# Patient Record
Sex: Male | Born: 1935 | Race: White | Hispanic: No | Smoking: Former smoker
Health system: Southern US, Community
[De-identification: ages and names within clinical notes are randomized; demographics above are authoritative.]

## PROBLEM LIST (undated history)

## (undated) DIAGNOSIS — R51 Headache: Secondary | ICD-10-CM

## (undated) HISTORY — DX: Headache: R51

---

## 1999-12-17 ENCOUNTER — Inpatient Hospital Stay (HOSPITAL_COMMUNITY): Admission: EM | Admit: 1999-12-17 | Discharge: 1999-12-21 | Payer: Self-pay | Admitting: Emergency Medicine

## 1999-12-17 ENCOUNTER — Encounter: Payer: Self-pay | Admitting: Emergency Medicine

## 1999-12-17 ENCOUNTER — Encounter: Payer: Self-pay | Admitting: Orthopaedic Surgery

## 2000-04-16 ENCOUNTER — Encounter: Admission: RE | Admit: 2000-04-16 | Discharge: 2000-07-15 | Payer: Self-pay | Admitting: Orthopaedic Surgery

## 2000-07-16 ENCOUNTER — Encounter: Admission: RE | Admit: 2000-07-16 | Discharge: 2000-08-17 | Payer: Self-pay | Admitting: Orthopaedic Surgery

## 2004-05-10 ENCOUNTER — Ambulatory Visit: Payer: Self-pay | Admitting: Family Medicine

## 2005-05-03 ENCOUNTER — Ambulatory Visit: Payer: Self-pay | Admitting: Family Medicine

## 2006-04-04 ENCOUNTER — Ambulatory Visit: Payer: Self-pay | Admitting: Family Medicine

## 2006-04-05 ENCOUNTER — Encounter: Admission: RE | Admit: 2006-04-05 | Discharge: 2006-04-05 | Payer: Self-pay | Admitting: Family Medicine

## 2006-09-06 ENCOUNTER — Ambulatory Visit: Payer: Self-pay | Admitting: Family Medicine

## 2006-09-06 LAB — CONVERTED CEMR LAB
ALT: 29 units/L (ref 0–40)
AST: 25 units/L (ref 0–37)
Albumin: 3.9 g/dL (ref 3.5–5.2)
Alkaline Phosphatase: 86 units/L (ref 39–117)
BUN: 12 mg/dL (ref 6–23)
Basophils Absolute: 0 10*3/uL (ref 0.0–0.1)
Basophils Relative: 0.2 % (ref 0.0–1.0)
Bilirubin, Direct: 0.2 mg/dL (ref 0.0–0.3)
CO2: 28 meq/L (ref 19–32)
Calcium: 9.1 mg/dL (ref 8.4–10.5)
Chloride: 106 meq/L (ref 96–112)
Cholesterol: 179 mg/dL (ref 0–200)
Creatinine, Ser: 0.6 mg/dL (ref 0.4–1.5)
Eosinophils Absolute: 0.2 10*3/uL (ref 0.0–0.6)
Eosinophils Relative: 2.4 % (ref 0.0–5.0)
GFR calc Af Amer: 170 mL/min
GFR calc non Af Amer: 141 mL/min
Glucose, Bld: 79 mg/dL (ref 70–99)
HCT: 47.5 % (ref 39.0–52.0)
HDL: 42.9 mg/dL (ref >39.0)
Hemoglobin: 16 g/dL (ref 13.0–17.0)
LDL Cholesterol: 118 mg/dL — ABNORMAL HIGH (ref 0–99)
Lymphocytes Relative: 16.4 % (ref 12.0–46.0)
MCHC: 33.7 g/dL (ref 30.0–36.0)
MCV: 92.3 fL (ref 78.0–100.0)
Monocytes Absolute: 0.7 10*3/uL (ref 0.2–0.7)
Monocytes Relative: 8.6 % (ref 3.0–11.0)
Neutro Abs: 6.2 10*3/uL (ref 1.4–7.7)
Neutrophils Relative %: 72.4 % (ref 43.0–77.0)
PSA: 3.16 ng/mL (ref 0.10–4.00)
Platelets: 313 10*3/uL (ref 150–400)
Potassium: 3.8 meq/L (ref 3.5–5.1)
RBC: 5.14 M/uL (ref 4.22–5.81)
RDW: 12.9 % (ref 11.5–14.6)
Sodium: 141 meq/L (ref 135–145)
TSH: 2.07 microintl units/mL (ref 0.35–5.50)
Total Bilirubin: 1.1 mg/dL (ref 0.3–1.2)
Total CHOL/HDL Ratio: 4.2
Total Protein: 6.9 g/dL (ref 6.0–8.3)
Triglycerides: 92 mg/dL (ref 0–149)
VLDL: 18 mg/dL (ref 0–40)
WBC: 8.5 10*3/uL (ref 4.5–10.5)

## 2006-09-19 ENCOUNTER — Ambulatory Visit: Payer: Self-pay | Admitting: Family Medicine

## 2006-10-04 ENCOUNTER — Ambulatory Visit: Payer: Self-pay | Admitting: Pulmonary Disease

## 2006-10-12 ENCOUNTER — Ambulatory Visit: Admission: RE | Admit: 2006-10-12 | Discharge: 2006-10-12 | Payer: Self-pay | Admitting: Pulmonary Disease

## 2006-10-12 ENCOUNTER — Encounter: Payer: Self-pay | Admitting: Pulmonary Disease

## 2006-10-17 ENCOUNTER — Ambulatory Visit: Payer: Self-pay | Admitting: Pulmonary Disease

## 2006-11-06 ENCOUNTER — Encounter (HOSPITAL_COMMUNITY): Admission: RE | Admit: 2006-11-06 | Discharge: 2007-02-04 | Payer: Self-pay | Admitting: Pulmonary Disease

## 2006-12-06 ENCOUNTER — Ambulatory Visit: Payer: Self-pay | Admitting: Family Medicine

## 2006-12-12 ENCOUNTER — Ambulatory Visit: Payer: Self-pay | Admitting: Pulmonary Disease

## 2006-12-20 ENCOUNTER — Encounter: Payer: Self-pay | Admitting: Family Medicine

## 2006-12-20 ENCOUNTER — Ambulatory Visit: Payer: Self-pay | Admitting: Family Medicine

## 2006-12-20 DIAGNOSIS — R519 Headache, unspecified: Secondary | ICD-10-CM | POA: Insufficient documentation

## 2006-12-20 DIAGNOSIS — R51 Headache: Secondary | ICD-10-CM | POA: Insufficient documentation

## 2007-01-15 ENCOUNTER — Ambulatory Visit: Payer: Self-pay | Admitting: Pulmonary Disease

## 2007-01-31 ENCOUNTER — Encounter: Payer: Self-pay | Admitting: Pulmonary Disease

## 2007-02-19 ENCOUNTER — Encounter (HOSPITAL_COMMUNITY): Admission: RE | Admit: 2007-02-19 | Discharge: 2007-05-20 | Payer: Self-pay | Admitting: Pulmonary Disease

## 2007-03-29 ENCOUNTER — Ambulatory Visit: Payer: Self-pay | Admitting: Family Medicine

## 2007-04-15 ENCOUNTER — Ambulatory Visit: Payer: Self-pay | Admitting: Pulmonary Disease

## 2007-05-21 ENCOUNTER — Encounter (HOSPITAL_COMMUNITY): Admission: RE | Admit: 2007-05-21 | Discharge: 2007-06-18 | Payer: Self-pay | Admitting: Pulmonary Disease

## 2007-06-20 ENCOUNTER — Encounter (HOSPITAL_COMMUNITY): Admission: RE | Admit: 2007-06-20 | Discharge: 2007-07-19 | Payer: Self-pay | Admitting: Pulmonary Disease

## 2007-07-21 ENCOUNTER — Encounter (HOSPITAL_COMMUNITY): Admission: RE | Admit: 2007-07-21 | Discharge: 2007-10-19 | Payer: Self-pay | Admitting: Pulmonary Disease

## 2007-09-12 IMAGING — CR DG CHEST 2V
2 series · 2 of 2 positions shown · non-contrast
Comparison: none

CLINICAL DATA: Shortness of breath, wheezing. 
 CHEST X-RAY: 
 Two views of the chest show the lungs to be hyperaerated consistent with COPD.  Mild peribronchial thickening is noted.  The heart is within normal limits in size.

[view not recorded (1 of 2)]
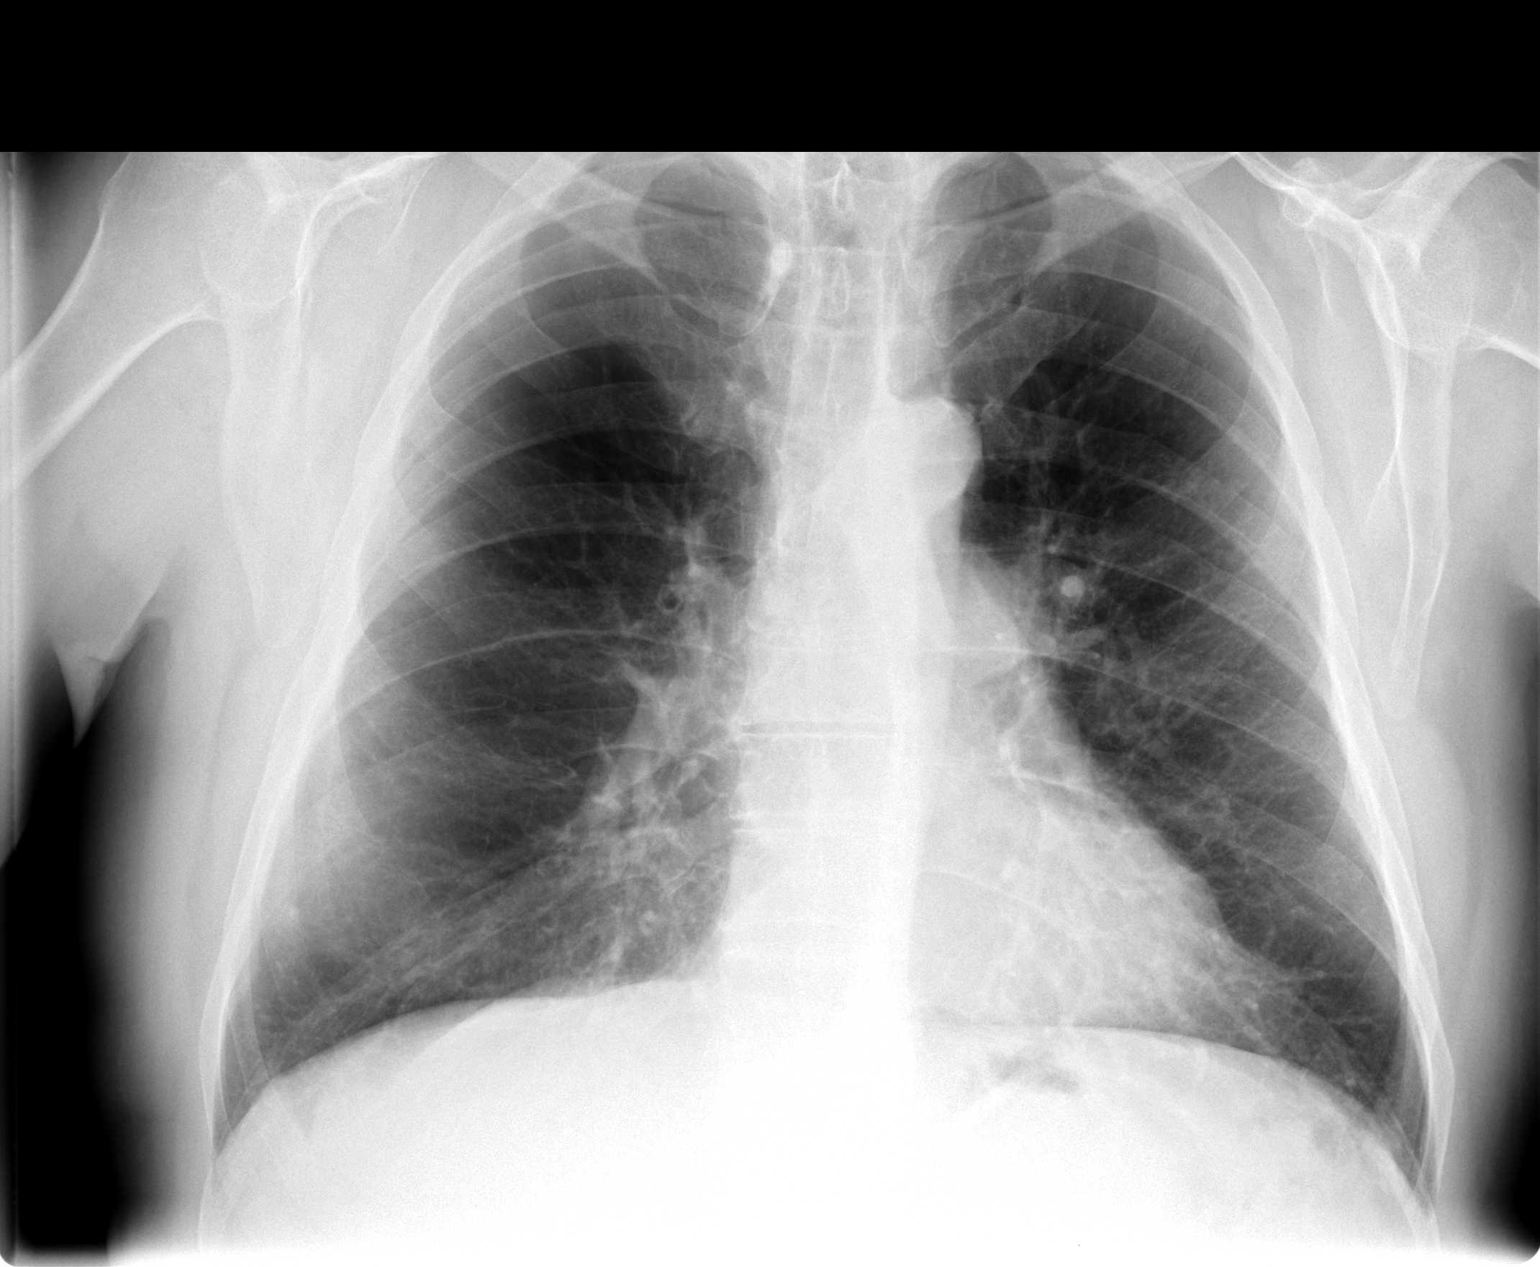

[view not recorded (2 of 2)]
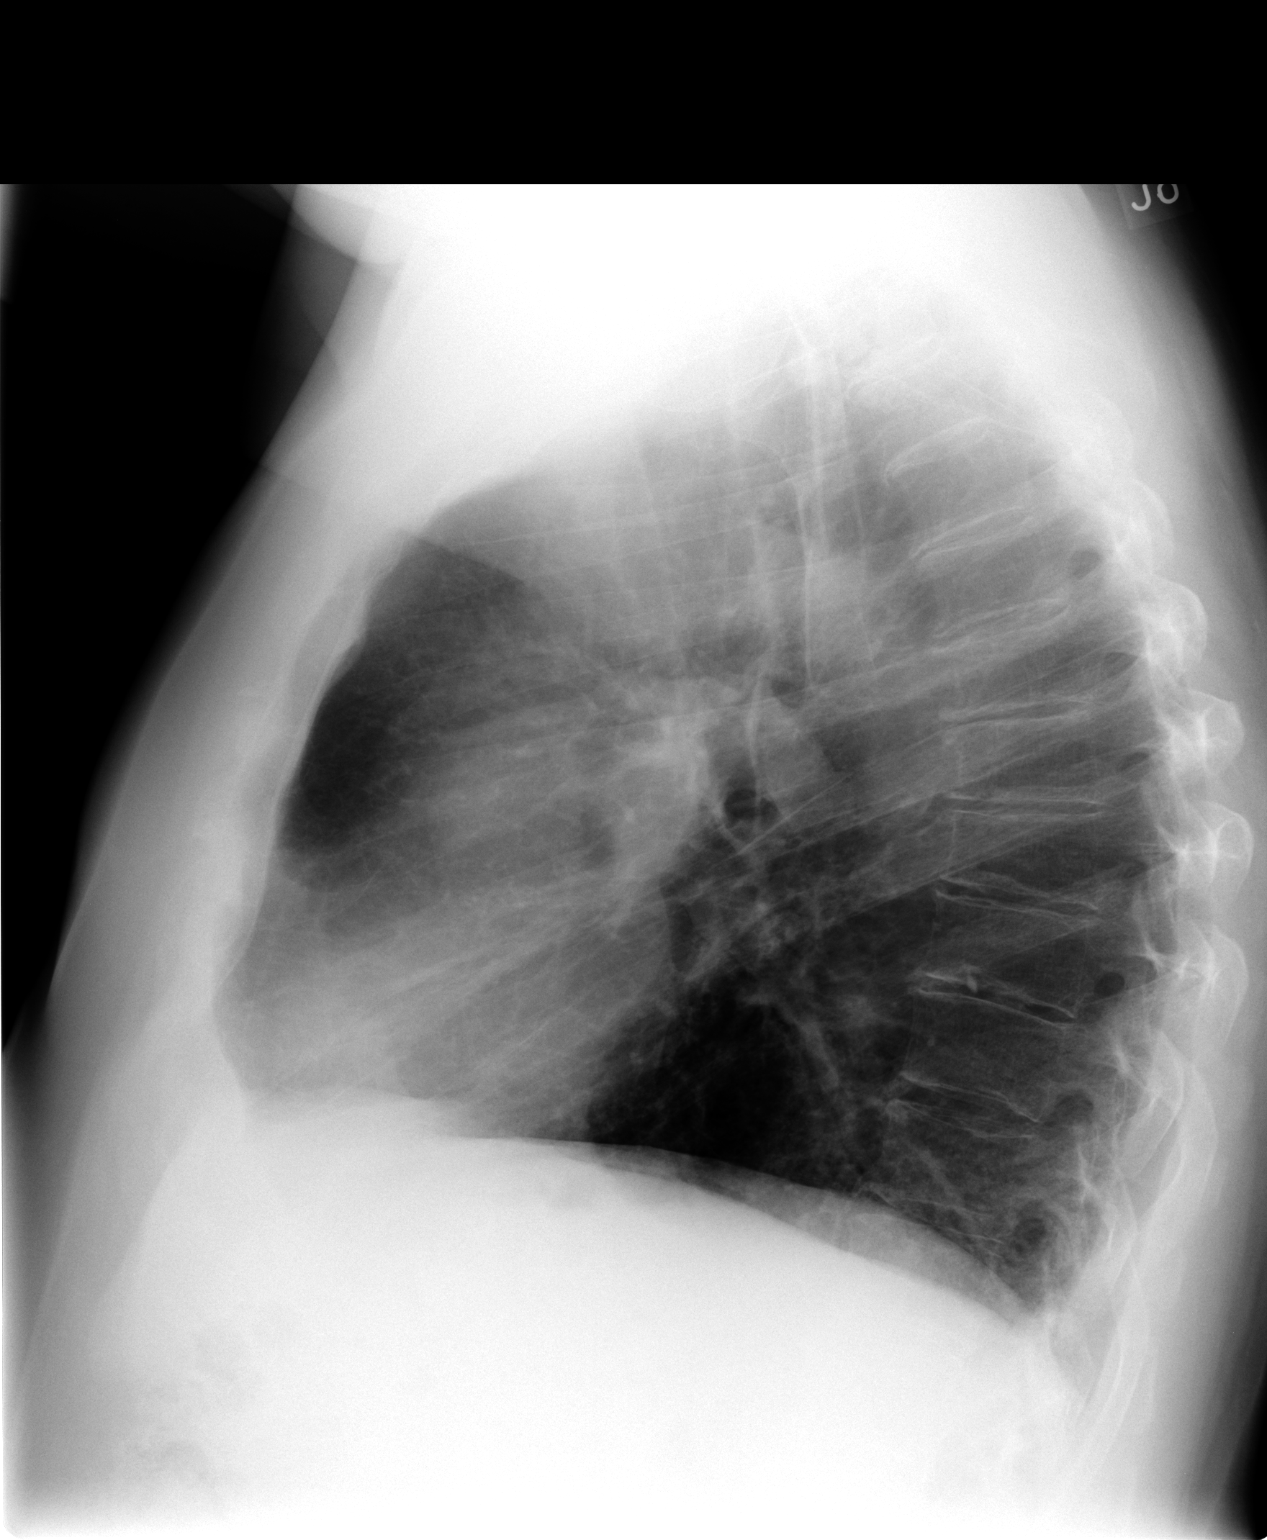

[2 of 2 positions shown; findings below may reference images not displayed]

IMPRESSION: COPD.  No active lung disease.

## 2007-10-20 ENCOUNTER — Encounter (HOSPITAL_COMMUNITY): Admission: RE | Admit: 2007-10-20 | Discharge: 2008-01-18 | Payer: Self-pay | Admitting: Pulmonary Disease

## 2007-11-13 DIAGNOSIS — J439 Emphysema, unspecified: Secondary | ICD-10-CM

## 2007-11-14 ENCOUNTER — Ambulatory Visit: Payer: Self-pay | Admitting: Pulmonary Disease

## 2007-11-14 DIAGNOSIS — J9610 Chronic respiratory failure, unspecified whether with hypoxia or hypercapnia: Secondary | ICD-10-CM

## 2007-12-25 ENCOUNTER — Encounter: Payer: Self-pay | Admitting: Critical Care Medicine

## 2008-01-19 ENCOUNTER — Encounter (HOSPITAL_COMMUNITY): Admission: RE | Admit: 2008-01-19 | Discharge: 2008-04-18 | Payer: Self-pay | Admitting: Pulmonary Disease

## 2008-03-08 ENCOUNTER — Encounter: Payer: Self-pay | Admitting: Pulmonary Disease

## 2008-03-13 ENCOUNTER — Ambulatory Visit: Payer: Self-pay | Admitting: Pulmonary Disease

## 2008-04-21 ENCOUNTER — Encounter (HOSPITAL_COMMUNITY): Admission: RE | Admit: 2008-04-21 | Discharge: 2008-07-19 | Payer: Self-pay | Admitting: Pulmonary Disease

## 2008-05-28 ENCOUNTER — Ambulatory Visit: Payer: Self-pay | Admitting: Pulmonary Disease

## 2008-07-16 ENCOUNTER — Ambulatory Visit: Payer: Self-pay | Admitting: Pulmonary Disease

## 2008-07-20 ENCOUNTER — Encounter (HOSPITAL_COMMUNITY): Admission: RE | Admit: 2008-07-20 | Discharge: 2008-10-18 | Payer: Self-pay | Admitting: Pulmonary Disease

## 2008-10-19 ENCOUNTER — Encounter (HOSPITAL_COMMUNITY): Admission: RE | Admit: 2008-10-19 | Discharge: 2009-01-17 | Payer: Self-pay | Admitting: Pulmonary Disease

## 2008-11-19 ENCOUNTER — Ambulatory Visit: Payer: Self-pay | Admitting: Pulmonary Disease

## 2008-11-20 ENCOUNTER — Telehealth (INDEPENDENT_AMBULATORY_CARE_PROVIDER_SITE_OTHER): Payer: Self-pay | Admitting: *Deleted

## 2009-01-11 ENCOUNTER — Encounter: Payer: Self-pay | Admitting: Pulmonary Disease

## 2009-01-14 ENCOUNTER — Encounter: Payer: Self-pay | Admitting: Family Medicine

## 2009-01-18 ENCOUNTER — Encounter (HOSPITAL_COMMUNITY): Admission: RE | Admit: 2009-01-18 | Discharge: 2009-04-18 | Payer: Self-pay | Admitting: Pulmonary Disease

## 2009-01-21 ENCOUNTER — Telehealth (INDEPENDENT_AMBULATORY_CARE_PROVIDER_SITE_OTHER): Payer: Self-pay | Admitting: *Deleted

## 2009-01-21 ENCOUNTER — Ambulatory Visit: Payer: Self-pay | Admitting: Family Medicine

## 2009-01-21 DIAGNOSIS — I10 Essential (primary) hypertension: Secondary | ICD-10-CM | POA: Insufficient documentation

## 2009-01-21 DIAGNOSIS — J4489 Other specified chronic obstructive pulmonary disease: Secondary | ICD-10-CM | POA: Insufficient documentation

## 2009-01-21 DIAGNOSIS — J449 Chronic obstructive pulmonary disease, unspecified: Secondary | ICD-10-CM

## 2009-03-02 ENCOUNTER — Encounter: Payer: Self-pay | Admitting: Family Medicine

## 2009-03-03 ENCOUNTER — Ambulatory Visit: Payer: Self-pay | Admitting: Family Medicine

## 2009-03-03 DIAGNOSIS — R609 Edema, unspecified: Secondary | ICD-10-CM

## 2009-03-03 DIAGNOSIS — E785 Hyperlipidemia, unspecified: Secondary | ICD-10-CM

## 2009-03-03 DIAGNOSIS — E039 Hypothyroidism, unspecified: Secondary | ICD-10-CM | POA: Insufficient documentation

## 2009-03-03 DIAGNOSIS — E559 Vitamin D deficiency, unspecified: Secondary | ICD-10-CM | POA: Insufficient documentation

## 2009-03-03 DIAGNOSIS — R350 Frequency of micturition: Secondary | ICD-10-CM

## 2009-03-03 LAB — CONVERTED CEMR LAB
Bilirubin Urine: NEGATIVE
Blood in Urine, dipstick: NEGATIVE
Glucose, Urine, Semiquant: NEGATIVE
Ketones, urine, test strip: NEGATIVE
Nitrite: NEGATIVE
Protein, U semiquant: NEGATIVE
Specific Gravity, Urine: 1.02
Urobilinogen, UA: 0.2
WBC Urine, dipstick: NEGATIVE
pH: 7

## 2009-03-06 LAB — CONVERTED CEMR LAB
ALT: 21 units/L (ref 0–53)
AST: 23 units/L (ref 0–37)
Albumin: 4.1 g/dL (ref 3.5–5.2)
Alkaline Phosphatase: 71 units/L (ref 39–117)
BUN: 11 mg/dL (ref 6–23)
Basophils Absolute: 0 10*3/uL (ref 0.0–0.1)
Basophils Relative: 0.1 % (ref 0.0–3.0)
Bilirubin, Direct: 0.2 mg/dL (ref 0.0–0.3)
CO2: 31 meq/L (ref 19–32)
Calcium: 9.4 mg/dL (ref 8.4–10.5)
Chloride: 106 meq/L (ref 96–112)
Cholesterol: 191 mg/dL (ref 0–200)
Creatinine, Ser: 0.8 mg/dL (ref 0.4–1.5)
Eosinophils Absolute: 0.2 10*3/uL (ref 0.0–0.7)
Eosinophils Relative: 2.1 % (ref 0.0–5.0)
GFR calc non Af Amer: 100.14 mL/min (ref >60)
Glucose, Bld: 91 mg/dL (ref 70–99)
HCT: 46 % (ref 39.0–52.0)
HDL: 36.3 mg/dL — ABNORMAL LOW (ref >39.00)
Hemoglobin: 15.9 g/dL (ref 13.0–17.0)
LDL Cholesterol: 139 mg/dL — ABNORMAL HIGH (ref 0–99)
Lymphocytes Relative: 11.6 % — ABNORMAL LOW (ref 12.0–46.0)
Lymphs Abs: 0.9 10*3/uL (ref 0.7–4.0)
MCHC: 34.6 g/dL (ref 30.0–36.0)
MCV: 91 fL (ref 78.0–100.0)
Monocytes Absolute: 0.5 10*3/uL (ref 0.1–1.0)
Monocytes Relative: 6.5 % (ref 3.0–12.0)
Neutro Abs: 6.4 10*3/uL (ref 1.4–7.7)
Neutrophils Relative %: 79.7 % — ABNORMAL HIGH (ref 43.0–77.0)
PSA: 3.18 ng/mL (ref 0.10–4.00)
Platelets: 260 10*3/uL (ref 150.0–400.0)
Potassium: 3.8 meq/L (ref 3.5–5.1)
RBC: 5.05 M/uL (ref 4.22–5.81)
RDW: 13.2 % (ref 11.5–14.6)
Sodium: 144 meq/L (ref 135–145)
TSH: 1.81 microintl units/mL (ref 0.35–5.50)
Total Bilirubin: 1.5 mg/dL — ABNORMAL HIGH (ref 0.3–1.2)
Total CHOL/HDL Ratio: 5
Total Protein: 7.4 g/dL (ref 6.0–8.3)
Triglycerides: 79 mg/dL (ref 0.0–149.0)
VLDL: 15.8 mg/dL (ref 0.0–40.0)
WBC: 8 10*3/uL (ref 4.5–10.5)

## 2009-03-11 ENCOUNTER — Telehealth: Payer: Self-pay | Admitting: Family Medicine

## 2009-04-02 ENCOUNTER — Ambulatory Visit: Payer: Self-pay | Admitting: Family Medicine

## 2009-04-02 ENCOUNTER — Telehealth: Payer: Self-pay | Admitting: Family Medicine

## 2009-04-19 ENCOUNTER — Encounter (HOSPITAL_COMMUNITY): Admission: RE | Admit: 2009-04-19 | Discharge: 2009-07-18 | Payer: Self-pay | Admitting: Pulmonary Disease

## 2009-05-20 ENCOUNTER — Ambulatory Visit: Payer: Self-pay | Admitting: Pulmonary Disease

## 2009-07-20 ENCOUNTER — Encounter (HOSPITAL_COMMUNITY): Admission: RE | Admit: 2009-07-20 | Discharge: 2009-10-18 | Payer: Self-pay | Admitting: Pulmonary Disease

## 2009-09-17 ENCOUNTER — Ambulatory Visit: Payer: Self-pay | Admitting: Pulmonary Disease

## 2009-10-25 ENCOUNTER — Encounter: Payer: Self-pay | Admitting: Pulmonary Disease

## 2010-01-05 ENCOUNTER — Ambulatory Visit: Payer: Self-pay | Admitting: Pulmonary Disease

## 2010-04-27 ENCOUNTER — Ambulatory Visit: Payer: Self-pay | Admitting: Family Medicine

## 2010-05-18 ENCOUNTER — Ambulatory Visit: Payer: Self-pay | Admitting: Pulmonary Disease

## 2010-07-19 NOTE — Assessment & Plan Note (Signed)
Summary: flu shot/cjr   Nurse Visit   Review of Systems       Flu Vaccine Consent Questions     Do you have a history of severe allergic reactions to this vaccine? no    Any prior history of allergic reactions to egg and/or gelatin? no    Do you have a sensitivity to the preservative Thimersol? no    Do you have a past history of Guillan-Barre Syndrome? no    Do you currently have an acute febrile illness? no    Have you ever had a severe reaction to latex? no    Vaccine information given and explained to patient? yes    Are you currently pregnant? no    Lot Number:[id number]   Exp Date:12/17/2010   Site Given  Left Deltoid IM Josph Macho RMA  April 27, 2010 2:59 PM    Allergies: No Known Drug Allergies  Orders Added: 1)  Flu Vaccine 6yrs + MEDICARE PATIENTS [Q2039] 2)  Administration Flu vaccine - MCR [G0008]

## 2010-07-19 NOTE — Assessment & Plan Note (Signed)
Summary: rov for emphysema   Primary Provider/Referring Provider:  Scotty Court  CC:  Pt is here for a 4 month f/u appt on his emphysema.  Pt states occ increased sob with exertion and non-productive cough. .  History of Present Illness: The pt comes in today for f/u of his severe emphysema with chronic respiratory failure.  He has noted a little worsening of his breathing lately, but cannot pinpoint a particular reason.  He denies chest congestion or purulence, and has not been wheezing.  He is spending time outside in the heat and humidity a lot, and this may be contributing.  He is no longer participating in pulmonary rehab, and tells me that he has become sedentary.  He has had no LE edema or chest discomfort.  Medications Prior to Update: 1)  Spiriva Handihaler 18 Mcg  Caps (Tiotropium Bromide Monohydrate) .... Inhale Contents of 1 Capsule Once A Day 2)  Adult Aspirin Low Strength 81 Mg  Tbdp (Aspirin) .... Once Daily 3)  Multivitamins   Tabs (Multiple Vitamin) .... Once Daily 4)  Proair Hfa 108 (90 Base) Mcg/act  Aers (Albuterol Sulfate) .... 2 Puffs Every 4 Hours As Needed 5)  Symbicort 160-4.5 Mcg/act  Aero (Budesonide-Formoterol Fumarate) .... Inhale 2 Puffs Two Times A Day 6)  Lisinopril 20 Mg Tabs (Lisinopril) .Marland Kitchen.. 1 Once Daily For Hypertension 7)  Fish Oil 1000 Mg Caps (Omega-3 Fatty Acids) .Marland Kitchen.. 1 By Mouth Two Times A Day  Allergies (verified): No Known Drug Allergies  Review of Systems       The patient complains of non-productive cough.  The patient denies shortness of breath with activity, shortness of breath at rest, productive cough, coughing up blood, chest pain, irregular heartbeats, acid heartburn, indigestion, loss of appetite, weight change, abdominal pain, difficulty swallowing, sore throat, tooth/dental problems, headaches, nasal congestion/difficulty breathing through nose, sneezing, itching, ear ache, anxiety, depression, hand/feet swelling, joint stiffness or pain,  rash, change in color of mucus, and fever.    Vital Signs:  Patient profile:   77 year old male Height:      67.5 inches Weight:      203.50 pounds O2 Sat:      91 % on 2 LPM pulsed Temp:     97.5 degrees F oral Pulse rate:   104 / minute BP sitting:   118 / 80  (left arm) Cuff size:   regular  Vitals Entered By: Arman Filter LPN (January 05, 2010 2:19 PM)  O2 Flow:  2 LPM pulsed CC: Pt is here for a 4 month f/u appt on his emphysema.  Pt states occ increased sob with exertion and non-productive cough.  Comments Medications reviewed with patient Arman Filter LPN  January 05, 2010 2:19 PM    Physical Exam  General:  ow male in nad Lungs:  decreased bs throughout, no wheezing or rhonchi Heart:  rrr, no mrg distant Extremities:  no edema or cyanosis Neurologic:  alert and oriented, moves all 4.   Impression & Recommendations:  Problem # 1:  EMPHYSEMA, SEVERE (ICD-492.8)  the pt's breathing is a little worse subjectively, but he is getting out a lot during the hot and humid part of the day.  He also is no longer in pulmonary rehab, and has become sedentary at home.  He is not wheezing today, and nothing by history to suggest a developing pulmonary infection.  However, can't exclude the possibility of a developing copd exacerbation.  He will keep an eye on this  and let us know if worsening.  I have asked him to get back on some type of conditioning program, and to avoid the hot part of the day.  Other Orders: Est. Patient Level III (15176)  Patient Instructions: 1)  will follow your breathing for now, but if you feel you are getting worse, please let us know. 2)  work on some type of exercise program 3)  no change in meds for now. 4)  followup with me in 4mos.

## 2010-07-19 NOTE — Assessment & Plan Note (Signed)
Summary: rov for emphysema   Primary Provider/Referring Provider:  Scotty Court  CC:  4 month follow up.  Pt states no changes in breathing - no better or worse.  However pt does state that he is starting to feel tired faster when doing activities.  Pt states he does have an occ dry cough.  Denies wheezing and chest tightness.  Marland Kitchen  History of Present Illness: the pt comes in today for f/u of his known oxygen dep. emphysema.  He has been compliant with his bronchodilators, oxygen, and exercise program, and feels that his exertional tolerance is at his usual baseline.  He does feel he gets fatigued faster though.  He has minimal dry cough, and has no chest congestion or purulence.  Current Medications (verified): 1)  Spiriva Handihaler 18 Mcg  Caps (Tiotropium Bromide Monohydrate) .... Inhale Contents of 1 Capsule Once A Day 2)  Adult Aspirin Low Strength 81 Mg  Tbdp (Aspirin) .... Once Daily 3)  Multivitamins   Tabs (Multiple Vitamin) .... Once Daily 4)  Proair Hfa 108 (90 Base) Mcg/act  Aers (Albuterol Sulfate) .... 2 Puffs Every 4 Hours As Needed 5)  Symbicort 160-4.5 Mcg/act  Aero (Budesonide-Formoterol Fumarate) .... Inhale 2 Puffs Two Times A Day 6)  Lisinopril 20 Mg Tabs (Lisinopril) .Marland Kitchen.. 1 Once Daily For Hypertension 7)  Fish Oil 1000 Mg Caps (Omega-3 Fatty Acids) .Marland Kitchen.. 1 By Mouth Two Times A Day  Allergies (verified): No Known Drug Allergies  Review of Systems      See HPI  Vital Signs:  Patient profile:   77 year old male Height:      67.5 inches Weight:      209.25 pounds BMI:     32.41 O2 Sat:      96 % on 2 L/minpulsed Temp:     98.2 degrees F oral Pulse rate:   86 / minute BP sitting:   142 / 86  (right arm) Cuff size:   large  Vitals Entered By: Gweneth Dimitri RN (September 17, 2009 1:47 PM)  O2 Flow:  2 L/minpulsed CC: 4 month follow up.  Pt states no changes in breathing - no better or worse.  However pt does state that he is starting to feel tired faster when doing  activities.  Pt states he does have an occ dry cough.  Denies wheezing and chest tightness.   Comments Medications reviewed with patient Daytime contact number verified with patient. Gweneth Dimitri RN  September 17, 2009 1:47 PM    Physical Exam  General:  ow male in nad Lungs:  decreased bs throughout, no wheezing. Heart:  rrr Extremities:  no edema or cyanosis Neurologic:  alert and oriented, moves all 4.   Impression & Recommendations:  Problem # 1:  EMPHYSEMA, SEVERE (ICD-492.8)  the pt is maintaining a stable baseline with respect to his exertional tolerance and pulmonary symptoms.  We are doing all we can for his lung disease, and I have asked him to stay as active as possible and to try and drop a little weight.  He is to continue on his current meds.  Other Orders: Est. Patient Level III (16109)  Patient Instructions: 1)  no change in meds 2)  stay active in rehab. 3)  work on weight loss 4)  can try zyrtec 10mg  one each day for nasal symptoms. 5)  followup with me in 4mos.

## 2010-07-19 NOTE — Miscellaneous (Signed)
Summary: Request to continue the program/[facility]  Request to continue the program/[facility]   Imported By: Sherian Rein 11/03/2009 12:24:31  _____________________________________________________________________  External Attachment:    Type:   Image     Comment:   External Document

## 2010-07-21 NOTE — Assessment & Plan Note (Signed)
Summary: Bradley Wallace for severe emphysema   Primary Provider/Referring Provider:  Scotty Court  CC:  4 month follow up c/o increase sob, feels breathing is more labored since last  visit, and increase swelling in ankles but  not  taking triamterene/hctz every day.  History of Present Illness: the pt comes in today for f/u of his known severe emphysema with chronic respiratory failure.  He is staying on his maintenance meds and supplemental oxygen, but does have a little more sob from last visit.  However, he also has had worsening LE edema for which he is supposed to be taking a diuretic daily (he has not been compliant with this).  He is not doing any kind of physical activity, and admits that his conditioning has worsened.  He denies chest congestion or cough.  Preventive Screening-Counseling & Management  Alcohol-Tobacco     Smoking Status: quit     Packs/Day: 0.5     Year Started: 1955     Year Quit: 2007  Current Medications (verified): 1)  Spiriva Handihaler 18 Mcg  Caps (Tiotropium Bromide Monohydrate) .... Inhale Contents of 1 Capsule Once A Day 2)  Adult Aspirin Low Strength 81 Mg  Tbdp (Aspirin) .... Once Daily 3)  Multivitamins   Tabs (Multiple Vitamin) .... Once Daily 4)  Proair Hfa 108 (90 Base) Mcg/act  Aers (Albuterol Sulfate) .... 2 Puffs Every 4 Hours As Needed 5)  Symbicort 160-4.5 Mcg/act  Aero (Budesonide-Formoterol Fumarate) .... Inhale 2 Puffs Two Times A Day 6)  Lisinopril 20 Mg Tabs (Lisinopril) .Marland Kitchen.. 1 Once Daily For Hypertension 7)  Fish Oil 1000 Mg Caps (Omega-3 Fatty Acids) .Marland Kitchen.. 1 By Mouth Two Times A Day 8)  Triamterene-Hctz 75-50 Mg Tabs (Triamterene-Hctz) .Marland Kitchen.. 1 Once Daily  Allergies (verified): No Known Drug Allergies  Social History: Packs/Day:  0.5  Review of Systems       The patient complains of shortness of breath with activity, non-productive cough, sore throat, sneezing, anxiety, depression, hand/feet swelling, and change in color of mucus.  The patient  denies shortness of breath at rest, productive cough, coughing up blood, chest pain, irregular heartbeats, acid heartburn, indigestion, loss of appetite, weight change, abdominal pain, difficulty swallowing, tooth/dental problems, headaches, nasal congestion/difficulty breathing through nose, itching, ear ache, joint stiffness or pain, rash, and fever.    Vital Signs:  Patient profile:   77 year old male Height:      69 inches Weight:      209.8 pounds O2 Sat:      92 % on 2 L/min Temp:     98 degrees F oral Pulse rate:   97 / minute BP sitting:   144 / 86  (left arm) Cuff size:   large  Vitals Entered By: Renold Genta RCP, LPN (May 18, 2010 2:28 PM)  O2 Sat at Rest %:  92% O2 Flow:  2 L/min CC: 4 month follow up c/o increase sob, feels breathing is more labored since last  visit, increase swelling in ankles but  not  taking triamterene/hctz every day Comments Medications reviewed with patient Renold Genta RCP, LPN  May 18, 2010 2:38 PM    Physical Exam  General:  ow male in nad Lungs:  very decreased bs throughout, no wheezing Heart:  distant, but sounds regular  Extremities:  1+ pedal edema bilat., no cyanosis  Neurologic:  alert and oriented, moves all 4.   Impression & Recommendations:  Problem # 1:  EMPHYSEMA, SEVERE (ICD-492.8) the pt has severe emphysema,  and appears to be not far from his usual baseline.  He has had some worsening doe that I suspect is due to poor conditioning and fluid overload.  I have asked him to stay on his diuretic daily, and to consider going back to pulmonary rehab program.  Otherwise, no change in his treatment regimen  Medications Added to Medication List This Visit: 1)  Triamterene-hctz 75-50 Mg Tabs (Triamterene-hctz) .Marland Kitchen.. 1 once daily  Other Orders: Est. Patient Level III (16109)  Patient Instructions: 1)  work on weight loss and some type of exercise program.  let me know if you would like to attend pulmonary rehab  again 2)  take your diuretic everyday for a period of time to see if you can get rid of some fluid. 3)  no change in breathing meds. 4)  followup with me in 4mos.

## 2010-09-12 ENCOUNTER — Encounter: Payer: Self-pay | Admitting: Pulmonary Disease

## 2010-09-14 ENCOUNTER — Encounter: Payer: Self-pay | Admitting: Pulmonary Disease

## 2010-09-14 ENCOUNTER — Ambulatory Visit (INDEPENDENT_AMBULATORY_CARE_PROVIDER_SITE_OTHER): Payer: Medicare Other | Admitting: Pulmonary Disease

## 2010-09-14 DIAGNOSIS — J9610 Chronic respiratory failure, unspecified whether with hypoxia or hypercapnia: Secondary | ICD-10-CM

## 2010-09-14 DIAGNOSIS — J438 Other emphysema: Secondary | ICD-10-CM

## 2010-09-14 NOTE — Patient Instructions (Signed)
No change in meds Work on weight loss and conditioning followup with me in 4mos

## 2010-09-14 NOTE — Assessment & Plan Note (Addendum)
The pt is near his usual baseline, and has not had a recent acute exacerbation.  I have stressed to him that wt loss and conditioning may be the only things that are going to improve his QOL significantly at this point.  Would add nebulized albuterol prn if begins to have more issues

## 2010-09-14 NOTE — Assessment & Plan Note (Signed)
The pt is chronically oxygen dependent, with adequate sats currently

## 2010-09-14 NOTE — Progress Notes (Signed)
  Subjective:    Patient ID: Bradley Wallace, male    DOB: 05-18-1932, 77 y.o.   MRN: [id number]  HPI The pt comes in today for f/u of his known severe emphysema with chronic respiratory failure.  He is maintaining a stable baseline, and reports no recent acute exacerbation.  He has no chest congestion or purulence.  He is having mild LE edema, but does not take his diuretic consistently.   Review of Systems  Constitutional: Negative for fever, appetite change and unexpected weight change.  HENT: Positive for congestion, rhinorrhea, sneezing and postnasal drip. Negative for ear pain, sore throat, trouble swallowing and dental problem.   Eyes: Negative for redness.  Respiratory: Positive for shortness of breath. Negative for cough and wheezing.   Cardiovascular: Negative for chest pain, palpitations and leg swelling.  Gastrointestinal: Negative for nausea, abdominal pain and diarrhea.  Genitourinary: Positive for dysuria.  Musculoskeletal: Negative for joint swelling.  Skin: Positive for rash.  Neurological: Negative for syncope and headaches.  Hematological: Does not bruise/bleed easily.  Psychiatric/Behavioral: Negative for dysphoric mood. The patient is not nervous/anxious.        Objective:   Physical Exam Ow male in nad  Chest with decreased bs, no wheezing or crackles Rrr, no mrg 1+ LE edema, no cyanosis  Alert, oriented, moves all 4        Assessment & Plan:

## 2010-09-19 ENCOUNTER — Other Ambulatory Visit: Payer: Self-pay | Admitting: Family Medicine

## 2010-09-19 ENCOUNTER — Other Ambulatory Visit: Payer: Self-pay | Admitting: Pulmonary Disease

## 2010-09-21 ENCOUNTER — Telehealth: Payer: Self-pay | Admitting: Pulmonary Disease

## 2010-09-21 MED ORDER — BUDESONIDE-FORMOTEROL FUMARATE 160-4.5 MCG/ACT IN AERO: 2.0000 | INHALATION_SPRAY | Freq: Two times a day (BID) | RESPIRATORY_TRACT | Status: DC

## 2010-09-21 MED ORDER — TIOTROPIUM BROMIDE MONOHYDRATE 18 MCG IN CAPS: 18.0000 ug | ORAL_CAPSULE | Freq: Every day | RESPIRATORY_TRACT | Status: DC

## 2010-09-21 NOTE — Telephone Encounter (Signed)
Pt states Spiriva and Symbicort need to go to [facility] on [address] for 90 day supply. RX sent.

## 2010-11-04 NOTE — Discharge Summary (Signed)
[facility]  Patient:    Bradley Wallace, Bradley Wallace                         MRN: [id number] Adm. Date:  [id number] Disc. Date: [id number] Attending:  Randolm Idol Dictator:   Jamelle Rushing, P.A.                           Discharge Summary  ADMISSION DIAGNOSIS:  Displaced, comminuted left distal tibial fracture and intra-articular extension with fracture of the left distal fibula.  DISCHARGE DIAGNOSIS:  Open reduction and internal fixation of left ankle fracture with external fixation.  HISTORY OF PRESENT ILLNESS:  The patient is a 77 year old white male who fell off a ladder, 2-3 feet to the ground.  The patient was initially stung by a bee, causing him to fall.  He experienced immediate onset of left ankle pain and inability to weightbear.  The patient was brought to the emergency room by EMS people for evaluation.  The x-rays demonstrated a displaced, comminuted distal left tibial fracture with intra-articular extension and distal fibular fracture.  ALLERGIES:  No known drug allergies.  CURRENT MEDICATIONS:  None.  OPERATIONS:  The patient was taken to the OR by Dr. Norlene Campbell, assisted by Dr. Frederico Hamman and Jamelle Rushing, P.A.C.  Under general anesthesia, the patient had open reduction and internal fixation of the distal fibular fracture.  The patient had external fixator application to the left tibia with limited internal fixation of the left tibia.  The patient tolerated the procedure well.  Was placed in a bulky dressing postoperatively.  Tourniquet time was 1 hour 55 minutes, with good capillary refill to the toes after dropping the tourniquet.  The patient was transferred to the recovery room in good condition.  CONSULTATIONS:  On December 19, 1999, the following routine consults were requested: 1. Occupational therapy. 2. Physical therapy.  HOSPITAL COURSE:  On December 17, 1999, the patient was admitted to the [facility]  under the care of Dr. Norlene Campbell.  The patient was taken to the OR, where an ORIF of the left distal fibular fracture was repaired and an external fixator was applied to the distal tibial fracture.  The patient tolerated the procedure well and was transferred to the recovery room and then to the floor in good condition.  The patient then proceeded to have a four-day postoperative course where the only initial problem was some nausea on the first postoperative day.  Otherwise, the patient proceeded to progress well, and he did have his PCA discontinued on postoperative day #2, transferred over to oral pain medications, and worked well with physical therapy and occupational therapy for transfers and crutch training.  The patient was, in fact, discharged home on postoperative day #4, afebrile.  Foot on the left was neuromotor vascularly intact and the wounds benign.  DISCHARGE INSTRUCTIONS: 1. Medications:    a. Percocet 1-2 tablets every four to six hours for pain if needed.    B. Nicoderm patch applied as per instructions. 2. Wound care:  Pin tract care three times a day as instructed with half    normal saline and half peroxide. 3. The patient is to call [phone] for a follow-up appointment the next week.  LABORATORY DATA:  EKG on admission was normal sinus rhythm with 95 beats per minute.  CBC on admission:  WBC 14.2, hemoglobin 15.7, hematocrit 45.1,  platelets 285. PT was 12.7 with an INR of 1.0, PTT of 29.  Routine chemistries found potassium of 5.1, all other values normal.  CONDITION ON DISCHARGE:  Good. DD:  01/28/00 TD:  01/30/00 Job: [id number]

## 2010-11-04 NOTE — Assessment & Plan Note (Signed)
[facility]                             PULMONARY OFFICE NOTE   Bradley Wallace, Bradley Wallace                       MRN:          [id number]  DATE:10/04/2006                            DOB:          11-11-1931    HISTORY OF PRESENT ILLNESS:  The patient is a 77 year old male who I  have been asked to see for dyspnea.  The patient states that last year  he began to develop shortness of breath and has been having progressive  symptoms over the last 6 months.  He underwent a chest x-ray in October  of 2007 with no abnormalities noted.  The patient has been tired on  Singulair as well as Advair and has really noted no difference in his  symptoms.  The patient has shortness of breath walking stairs and also  less than 1 block dyspnea on exertion at a moderate pace on flat ground.  He has no difficulties bringing groceries in from the car, vacuuming one  room of his house or making the bed.  He has no cough or mucus and no  lower extremity edema.  He has no history of heart disease and no prior  cardiac work up.  Of note, his weight is up about 10-15 pounds over the  last one year.   PAST MEDICAL HISTORY:  Significant for chronic headaches, otherwise is  unremarkable.   CURRENT MEDICATIONS:  1. Aspirin 81 mg daily.  2. Singulair 10 mg daily.  3. Advair 250/50 mg 1 b.i.d.  4. ProAir p.r.n.   The patient has no known drug allergies.   SOCIAL HISTORY:  He has a history of smoking 15 cigarettes a day for 40  years.  He has not smoked since 2007.  He is widowed and has children.  He is retired from [employer].   FAMILY HISTORY:  Noncontributory in first degree relatives.   REVIEW OF SYSTEMS:  As per history of present illness, also see patient  intake form documented on the chart.   PHYSICAL EXAMINATION:  GENERAL:  He is a overweight male in no acute  distress.  Blood pressure is 130/82, pulse 99, temperature is 97.9, weight is 210  pounds, O2  saturation on room air is 92%.  HEENT:  Pupils equal, round and reactive to light and accommodation,  extraocular muscles are intact, nares are patent without discharge,  oropharynx is clear.  NECK:  Supple without jugular venous distension or lymphadenopathy,  there is no palpable thyromegaly.  CHEST:  Totally clear to auscultation.  CARDIAC:  Reveals regular rate and rhythm, no murmurs, rubs or gallops.  ABDOMEN:  Soft, nontender with good bowel sounds.  GENITAL, RECTAL, BREAST:  Not done and not indicated.  LOWER EXTREMITIES:  Shows slight ankle edema, pulses are intact  distally.  NEUROLOGICALLY:  Alert and oriented with no obvious motor deficit.   IMPRESSION:  Dyspnea on exertion, which is primarily with moderate  exertion, but is interfering with his quality of life.  The patient has  been tried on Advair and Singulair with no improvement.  It is unclear  to me whether this is a pulmonary issue or whether he possibly could  have cardiac problems as well.  This may also be due to his weight and  conditioning and perhaps even his perception.  At this point in time I  think he needs to have full pulmonary function tests done to try and  determine if there are pulmonary sources to his shortness of breath.   PLAN:  1. Stop Advair and Singulair and use ProAir only p.r.n.  2. Schedule for full pulmonary function tests in approximately 2-3      weeks after medications have been washed out of his system.  3. The patient will follow up after the above.     Barbaraann Share, MD,FCCP  Electronically Signed    [facility]  DD: 10/19/2006  DT: 10/19/2006  Job #: [id number]   cc:   Ellin Saba., MD

## 2010-11-04 NOTE — Op Note (Signed)
[place]  Patient:    Bradley Wallace, Bradley Wallace                         MRN: [id number] Proc. Date: 12/17/99 Adm. Date:  [id number] Attending:  Randolm Idol                           Operative Report  PREOPERATIVE DIAGNOSIS:  Displaced comminuted intra-articular fracture left distal tibia (pilon) with a displaced distal fibula fracture.  POSTOPERATIVE DIAGNOSIS:  Displaced comminuted intra-articular fracture left distal tibia (pilon) with a displaced distal fibula fracture.  OPERATION: 1. External fixator application to left tibia with limited internal fixation    of left tibia. 2. Open reduction internal fixation of distal fibula fracture.  SURGEON:  Claude Manges. Cleophas Dunker, M.D.  ASSISTANTS:  Sharlot Gowda., M.D. and Jamelle Rushing, P.A.  ANESTHESIA:  General endotracheal.  COMPLICATIONS:  None.  DESCRIPTION OF OPERATION:  With the patient comfortable on the operating table and under general endotracheal anesthesia, the left lower extremity was placed in a thigh tourniquet.  The left leg was then prepped with Dura-Prep from the tips of the toes to the knees.  Sterile draping was performed.  With the extremity still elevated, it was Esmarch exsanguinated with the proximal tourniquet at 350 mmHg.  The fracture was extremely comminuted and involving the distal diaphyseal and metaphyseal region of the tibia.  It was initially in valgus position with intra-articular extension.  There was also some rotatory deformity.  It was felt that external fixation would provide stability to the tibia and perhaps we could improve the alignment.  Accordingly, we initially applied the external fixator using the Ortho-Fix system.  Guide pins were placed into the talus and os calcis per the protocol and drill holes were made using the guides.  When we established good position in AP and lateral image intensification, the appropriate HA coated 4.8 mm screws  were inserted and again checked in image intensification.  The external fixator was then applied to these two screws.  Using the proximal pin inserter as a guide, we inserted two more proximal tibial pins.  At that point, we manipulated the fracture under image intensification and we had difficulty aligning the fracture in what we thought was a reasonable position.  The distal metaphyseal region of the tibia did not move with the talus.  We thought there was considerable amount of impaction at the fracture site which maintained its valgus position.  We felt with distraction and keeping the talus parallel to the knee joint, it was the best we could accomplish, as we established a length and allowed the fibula to remain in the normal position from its fractured valgus position.  A longitudinal incision was then made over the distal fibula and very sharply through the subcutaneous tissue.  Then, by blunt dissection, a large hematoma was removed from above the fibula.  The fibula was identified.  It was really quite comminuted, which is not what we expected per the films.  An 8 hole, one-third tubular, Ace titanium plate was then contoured to fit the fibula and 6 of the 8 holes were then filled with screws after appropriate drilling measuring and using cortical screws proximally and the two distal screws were fully threaded cancellous screws.  We actually had good purchase in the bone to the plate.  We manipulated the distal tibial metaphyseal fracture by  inserting a freer above the fibula and we felt we had an improved position from its previous impacted and more valgus position.  So accordingly, we inserted a diastasis screw, more as an attempt to lag the articular surface and to hold it in a more neutral position from its valgus position, at the time of initial fracture.  We felt we had accomplished.  At this point we repeated the image intensification views in AP and lateral projection  and thought we had reestablished length and the fracture was still quite comminuted, but we also felt further internal fixation of the tibia might cause more problems with exposure, potential skin loss or neurovascular compromise.  Accordingly, the lateral wound was irrigated with saline solution, the fascia closed with 2-0 Vicryl and the skin closed with skin clips.  A sterile bulky dressing was applied.  The tourniquet was deflated after an hour and 55 minutes with immediate capillary refill to the toes. DD:  12/17/99 TD:  12/18/99 Job: [id number]

## 2010-11-04 NOTE — Assessment & Plan Note (Signed)
[facility]                             PULMONARY OFFICE NOTE   Bradley, Wallace                       MRN:          [id number]  DATE:10/17/2006                            DOB:          1932/02/17    SUBJECTIVE:  Bradley Wallace comes in today after his pulmonary function  studies.  He was found to have severe obstructive lung disease with a  FEV1 of 1.13 and a FEV1% of 46.  There was no significant change with  bronchodilators.  He had definite air trapping on his lung volumes, but  no restriction and his diffusion capacity was moderately reduced.  I  have gone over these in great detail with Bradley Wallace and explained these  are most likely secondary to emphysema since he really does not have a  history consistent with asthma.   PHYSICAL EXAMINATION:  GENERAL:  He is an overweight male in no acute  distress.  Blood pressure is 140/88, pulse 91, temperature is 98, weight is 211  pounds, O2 saturation on room air is 91%.   IMPRESSION:  Severe emphysema by pulmonary function studies.  I suspect  this is the primary cause of his shortness of breath, although I can not  exclude possible cardiac etiologies.  I think at this point in time that  I would like to treat him with anticholinergics, which he has not tried  before and also get him to work on weight loss and conditioning.  I  think he would benefit from pulmonary rehab.  If he continues to have  significant dyspnea on exertion then he would probably need a cardiac  workup for completeness.   PLAN:  1. Start Spiriva 1 p.o. daily and stay on p.r.n. albuterol.  2. Refer to pulmonary rehab.  3. The patient will follow up in 8 weeks or sooner if there is      problems.  If he continues to have great difficulty I would do a      cardiac workup for completeness.     Barbaraann Share, MD,FCCP  Electronically Signed    [facility]  DD: 10/19/2006  DT: 10/19/2006  Job #: [id number]   cc:   Ellin Saba., MD

## 2010-12-27 ENCOUNTER — Other Ambulatory Visit: Payer: Self-pay | Admitting: Family Medicine

## 2010-12-30 ENCOUNTER — Other Ambulatory Visit: Payer: Self-pay

## 2010-12-30 MED ORDER — LISINOPRIL 20 MG PO TABS: 20.0000 mg | ORAL_TABLET | Freq: Every day | ORAL | Status: DC

## 2010-12-30 NOTE — Telephone Encounter (Signed)
Pt called and is aware that an office visit is needed.  Pt states he will call back and make an appt for later this month.  Prescription for lisinopril 20 mg was sent to pharmacy.  Pt is aware.

## 2011-01-11 ENCOUNTER — Ambulatory Visit: Payer: Medicare Other | Admitting: Pulmonary Disease

## 2011-01-17 ENCOUNTER — Ambulatory Visit (INDEPENDENT_AMBULATORY_CARE_PROVIDER_SITE_OTHER)
Admission: RE | Admit: 2011-01-17 | Discharge: 2011-01-17 | Disposition: A | Discharge status: Home / Self Care | Payer: Medicare Other | Source: Ambulatory Visit | Attending: Pulmonary Disease | Admitting: Pulmonary Disease

## 2011-01-17 ENCOUNTER — Ambulatory Visit (INDEPENDENT_AMBULATORY_CARE_PROVIDER_SITE_OTHER): Payer: Medicare Other | Admitting: Pulmonary Disease

## 2011-01-17 ENCOUNTER — Encounter: Payer: Self-pay | Admitting: Pulmonary Disease

## 2011-01-17 DIAGNOSIS — J9610 Chronic respiratory failure, unspecified whether with hypoxia or hypercapnia: Secondary | ICD-10-CM

## 2011-01-17 DIAGNOSIS — J438 Other emphysema: Secondary | ICD-10-CM

## 2011-01-17 NOTE — Patient Instructions (Signed)
Continue on same medications for your breathing. If you breathing worsens further, please call me.  If stays stable, then improves with cooler temps and less humidity, then that is most likely the answer You may need cardiac evaluation if your breathing continues to worsen Will check cxr today, and call you with results. followup with me in 4mos.

## 2011-01-17 NOTE — Progress Notes (Signed)
  Subjective:    Patient ID: Bradley Wallace, male    DOB: 19-Jun-1932, 77 y.o.   MRN: [id number]  HPI The pt comes in today for f/u of his known emphysema with chronic respiratory failure.  He feels his breathing has worsened the last few mos, but is not having chest congestion or an acute exacerbation.  He thinks it may be due to the high heat and humidity.  He denies any chest discomfort or LE edema.  He is staying on his oxygen and BD regimen.    Review of Systems  Constitutional: Negative for fever and unexpected weight change.  HENT: Positive for rhinorrhea. Negative for ear pain, nosebleeds, congestion, sore throat, sneezing, trouble swallowing, dental problem, postnasal drip and sinus pressure.   Eyes: Negative for redness and itching.  Respiratory: Positive for shortness of breath. Negative for cough, chest tightness and wheezing.   Cardiovascular: Negative for palpitations and leg swelling.  Gastrointestinal: Negative for nausea and vomiting.  Genitourinary: Negative for dysuria.  Musculoskeletal: Negative for joint swelling.  Skin: Negative for rash.  Neurological: Positive for headaches.  Hematological: Does not bruise/bleed easily.  Psychiatric/Behavioral: Negative for dysphoric mood. The patient is not nervous/anxious.        Objective:   Physical Exam Ow male in nad Nares without discharge or purulence Chest with decreased bs, no wheezing or rhonchi Cor with rrr LE with mild edema in ankles, no cyanosis  Alert and oriented, moves all 4        Assessment & Plan:   No problem-specific assessment & plan notes found for this encounter.

## 2011-01-20 NOTE — Assessment & Plan Note (Signed)
The pt has severe emphysema with worsening symptoms most recently.  He has no evidence for an acute infection or copd exacerbation.  There is no bronchospasm on exam.  I suspect this is due to the heat/humidity/poor air quality at this point, but would like to check a cxr for completeness.  If he continues to have issues into the fall, or certainly if he gets worse, might need to consider cardiac evaluation?Bradley Wallace

## 2011-01-24 ENCOUNTER — Other Ambulatory Visit: Payer: Self-pay | Admitting: Pulmonary Disease

## 2011-04-03 ENCOUNTER — Other Ambulatory Visit: Payer: Self-pay | Admitting: Family Medicine

## 2011-04-19 ENCOUNTER — Ambulatory Visit (INDEPENDENT_AMBULATORY_CARE_PROVIDER_SITE_OTHER): Payer: Medicare Other

## 2011-04-19 DIAGNOSIS — Z23 Encounter for immunization: Secondary | ICD-10-CM

## 2011-05-23 ENCOUNTER — Ambulatory Visit (INDEPENDENT_AMBULATORY_CARE_PROVIDER_SITE_OTHER): Payer: Medicare Other | Admitting: Pulmonary Disease

## 2011-05-23 ENCOUNTER — Encounter: Payer: Self-pay | Admitting: Pulmonary Disease

## 2011-05-23 VITALS — BP 140/86 | HR 103 | Temp 98.0°F | Ht 69.0 in | Wt 210.8 lb

## 2011-05-23 DIAGNOSIS — J438 Other emphysema: Secondary | ICD-10-CM

## 2011-05-23 DIAGNOSIS — J9610 Chronic respiratory failure, unspecified whether with hypoxia or hypercapnia: Secondary | ICD-10-CM

## 2011-05-23 NOTE — Patient Instructions (Signed)
No change in medications Work on weight reduction and some type of conditioning program. followup with me in 4mos.

## 2011-05-23 NOTE — Progress Notes (Signed)
  Subjective:    Patient ID: Bradley Wallace, male    DOB: 1931/07/02, 77 y.o.   MRN: [id number]  HPI The patient comes in today for followup of his known severe emphysema with chronic respiratory failure.  He is continuing on his usual bronchodilator medication, and has not had an acute exacerbation since his last visit.  He feels that his exertional tolerance is at baseline, but admits that he really has not done any type of physical activity.  He denies any current chest congestion her mucus.   Review of Systems  Constitutional: Negative for fever and unexpected weight change.  HENT: Negative for ear pain, nosebleeds, congestion, sore throat, rhinorrhea, sneezing, trouble swallowing, dental problem, postnasal drip and sinus pressure.   Eyes: Negative for redness and itching.  Respiratory: Positive for shortness of breath. Negative for cough, chest tightness and wheezing.   Cardiovascular: Negative for palpitations and leg swelling.  Gastrointestinal: Negative for nausea and vomiting.  Genitourinary: Negative for dysuria.  Musculoskeletal: Negative for joint swelling.  Skin: Negative for rash.  Neurological: Negative for headaches.  Hematological: Does not bruise/bleed easily.  Psychiatric/Behavioral: Negative for dysphoric mood. The patient is not nervous/anxious.        Objective:   Physical Exam Overweight male in no acute distress Nose without purulence or discharge noted Chest with decreased breath sounds, no wheezes or rhonchi Cardiac exam with distant breath sounds, regular, no murmurs noted Lower extremities with 1+ ankle edema, no cyanosis Alert and oriented, moves all 4 extremities.       Assessment & Plan:

## 2011-05-23 NOTE — Assessment & Plan Note (Signed)
The patient appears to be at his usual baseline since the last visit.  He has had no change in his exertional tolerance, and has not had an acute exacerbation of his lung disease.  He did have a URI last week that has basically resolved.  I have asked him to continue on his current bronchodilator regimen, and stressed to him the importance of weight reduction and some type of conditioning program.

## 2011-06-29 ENCOUNTER — Other Ambulatory Visit: Payer: Self-pay | Admitting: Family Medicine

## 2011-06-30 NOTE — Telephone Encounter (Signed)
Dr. Caryl Never,  This is a Bradley Wallace pt. We sent the letter months ago of course, and then called him on 1/9 and LMOM that he needed to establish with someone. Can you refill this a last time? Please advise - Elease Hashimoto is not sure what to do at this point. Thank you!

## 2011-06-30 NOTE — Telephone Encounter (Signed)
Refill for 3 months. 

## 2011-07-03 NOTE — Telephone Encounter (Signed)
Couldn't tell who he sent this to. Did you get this? Thanks!

## 2011-07-03 NOTE — Telephone Encounter (Signed)
Rx sent to pharmacy   

## 2011-07-03 NOTE — Telephone Encounter (Signed)
Called pt and left a message for a return call about finding a new pcp.

## 2011-09-19 ENCOUNTER — Ambulatory Visit: Payer: Medicare Other | Admitting: Pulmonary Disease

## 2011-10-05 ENCOUNTER — Other Ambulatory Visit: Payer: Self-pay | Admitting: Family Medicine

## 2011-10-18 ENCOUNTER — Other Ambulatory Visit: Payer: Self-pay | Admitting: Family Medicine

## 2011-10-19 NOTE — Telephone Encounter (Signed)
Called pt and left a voicemail for return call.  Pt last seen in 2010.  Pls advise.

## 2011-10-20 NOTE — Telephone Encounter (Signed)
Refill once only. 

## 2011-10-24 ENCOUNTER — Ambulatory Visit (INDEPENDENT_AMBULATORY_CARE_PROVIDER_SITE_OTHER): Payer: Medicare Other | Admitting: Pulmonary Disease

## 2011-10-24 ENCOUNTER — Encounter: Payer: Self-pay | Admitting: Pulmonary Disease

## 2011-10-24 VITALS — BP 146/80 | HR 108 | Temp 97.8°F | Ht 69.0 in | Wt 211.8 lb

## 2011-10-24 DIAGNOSIS — J438 Other emphysema: Secondary | ICD-10-CM

## 2011-10-24 DIAGNOSIS — J9610 Chronic respiratory failure, unspecified whether with hypoxia or hypercapnia: Secondary | ICD-10-CM

## 2011-10-24 NOTE — Assessment & Plan Note (Signed)
The patient overall is near his usual baseline.  He has seen some increase in shortness of breath since the last visit, but it is not overly significant.  He has not had an acute exacerbation pulmonary infection since the last visit.  I have asked him to stay on his current medications and oxygen, and to try and stay as active as possible.  He is to call if he feels that his breathing continues to worsen.

## 2011-10-24 NOTE — Patient Instructions (Addendum)
Will find you a primary care physician.  Someone will be contacting you about an apptm.  If you do not hear from [place] within 2 weeks, let me know. No change in your pulmonary medications Try and stay as active as possible. When doing heavier exertional activities, use either "demand 4" or "continuous 3".  Once you slow down or sit down, can change back to usual setting.  followup with me in 4mos.

## 2011-10-24 NOTE — Progress Notes (Signed)
  Subjective:    Patient ID: Bradley Wallace, male    DOB: 03/20/1932, 78 y.o.   MRN: [id number]  HPI The patient comes in today for followup of his known severe emphysema with chronic respiratory failure.  He is staying on his medications compliantly, as well as his oxygen.  He has not had an acute exacerbation or infection since the last visit.  He has noted some increasing shortness of breath with exertion since the last visit.  He denies any increased chest congestion or lower extremity edema.   Review of Systems  Constitutional: Negative for fever and unexpected weight change.  HENT: Positive for rhinorrhea. Negative for ear pain, nosebleeds, congestion, sore throat, sneezing, trouble swallowing, dental problem, postnasal drip and sinus pressure.   Eyes: Negative for redness and itching.  Respiratory: Positive for shortness of breath. Negative for cough, chest tightness and wheezing.   Cardiovascular: Negative for palpitations and leg swelling.  Gastrointestinal: Negative for nausea and vomiting.  Genitourinary: Negative for dysuria.  Musculoskeletal: Negative for joint swelling.  Skin: Negative for rash.  Neurological: Negative for headaches.  Hematological: Does not bruise/bleed easily.  Psychiatric/Behavioral: Negative for dysphoric mood. The patient is not nervous/anxious.        Objective:   Physical Exam Well-developed male in no acute distress Nose without purulence or discharge noted Chest with decreased breath sounds, but no wheezes or rhonchi Cardiac exam with regular rate and rhythm Lower extremities without edema, no cyanosis Alert and oriented, moves all 4 extremities.       Assessment & Plan:

## 2011-11-01 ENCOUNTER — Telehealth: Payer: Self-pay | Admitting: Pulmonary Disease

## 2011-11-01 MED ORDER — LISINOPRIL 20 MG PO TABS: 20.0000 mg | ORAL_TABLET | Freq: Every day | ORAL | Status: DC

## 2011-11-01 NOTE — Telephone Encounter (Signed)
KC--called and spoke with pt and he is requesting that a refill of the lisinopril be sent to the pharmacy. He stated that Dr. Sander Radon retired and they will no longer refill this med.  He has appt with Dr. Felicity Coyer in Primary care  But unable to see him until nov.  Please advise if ok to send in refill for the lisinopril  thanks

## 2011-11-01 NOTE — Telephone Encounter (Signed)
Pt also need spiriva and symbicort sent in.

## 2011-11-01 NOTE — Telephone Encounter (Signed)
dpr did not allow anyone access to his chart or to leave a message.    Refills sent to [facility] on [address].  LMOM TCB x1 to inform pt of this and KC's recs.  #90 sent as last refilled by Dr Sander Radon.

## 2011-11-01 NOTE — Telephone Encounter (Signed)
Ok to send in script until he see Dr. Felicity Coyer (who by the way is a woman not "him") Let him know though that he may need to see someone in urgent care until then if problems arise.

## 2011-11-02 NOTE — Telephone Encounter (Signed)
LMTCB x2  

## 2011-11-02 NOTE — Telephone Encounter (Signed)
Advised pt that the refills were sent to [facility] on [address] & informed pt of KC's recs.  Pt acknowledged understanding & stated nothing further needed at this time.  Antionette Fairy

## 2011-11-03 ENCOUNTER — Telehealth: Payer: Self-pay | Admitting: Pulmonary Disease

## 2011-11-06 NOTE — Telephone Encounter (Signed)
DMV form placed in [facility] VIP folder for signature.

## 2011-11-06 NOTE — Telephone Encounter (Signed)
Done

## 2011-11-07 NOTE — Telephone Encounter (Signed)
Pt provided a self addressed stamped envelope and the forms have been mailed. LMOM to let the pt know this had been done and to call if he has any questions. Forms copied and placed in [facility] scan folder.

## 2011-11-07 NOTE — Telephone Encounter (Signed)
Lawson Fiscal, do you have these forms. Please advise thanks

## 2011-11-27 ENCOUNTER — Telehealth: Payer: Self-pay | Admitting: Pulmonary Disease

## 2011-11-27 MED ORDER — TIOTROPIUM BROMIDE MONOHYDRATE 18 MCG IN CAPS: 18.0000 ug | ORAL_CAPSULE | Freq: Every day | RESPIRATORY_TRACT | Status: DC

## 2011-11-27 MED ORDER — BUDESONIDE-FORMOTEROL FUMARATE 160-4.5 MCG/ACT IN AERO: 2.0000 | INHALATION_SPRAY | Freq: Two times a day (BID) | RESPIRATORY_TRACT | Status: DC

## 2011-11-27 NOTE — Telephone Encounter (Signed)
RX's have been sent and pt is aware. Nothing further was needed 

## 2012-02-27 ENCOUNTER — Ambulatory Visit (INDEPENDENT_AMBULATORY_CARE_PROVIDER_SITE_OTHER): Payer: Medicare Other | Admitting: Pulmonary Disease

## 2012-02-27 ENCOUNTER — Encounter: Payer: Self-pay | Admitting: Pulmonary Disease

## 2012-02-27 VITALS — BP 150/82 | HR 96 | Temp 98.3°F | Ht 69.0 in | Wt 208.2 lb

## 2012-02-27 DIAGNOSIS — J438 Other emphysema: Secondary | ICD-10-CM

## 2012-02-27 DIAGNOSIS — Z23 Encounter for immunization: Secondary | ICD-10-CM

## 2012-02-27 DIAGNOSIS — J9610 Chronic respiratory failure, unspecified whether with hypoxia or hypercapnia: Secondary | ICD-10-CM

## 2012-02-27 NOTE — Progress Notes (Signed)
  Subjective:    Patient ID: Bradley Wallace, male    DOB: February 27, 1932, 78 y.o.   MRN: [id number]  HPI The patient comes in today for followup of his known COPD with chronic respiratory failure.  He is continuing on his bronchodilator regimen and oxygen, and has not had an acute exacerbation or pulmonary infection since the last visit.  He feels that his exertional tolerance is at baseline.  He is getting ready to move to [place], but would like to continue coming here for his pulmonary followup.   Review of Systems  Constitutional: Negative for fever and unexpected weight change.  HENT: Positive for postnasal drip. Negative for ear pain, nosebleeds, congestion, sore throat, rhinorrhea, sneezing, trouble swallowing, dental problem and sinus pressure.   Eyes: Negative for redness and itching.  Respiratory: Positive for shortness of breath. Negative for cough, chest tightness and wheezing.   Cardiovascular: Negative for palpitations and leg swelling.  Gastrointestinal: Negative for nausea and vomiting.  Genitourinary: Negative for dysuria.  Musculoskeletal: Negative for joint swelling.  Skin: Negative for rash.  Neurological: Negative for headaches.  Hematological: Does not bruise/bleed easily.  Psychiatric/Behavioral: Negative for dysphoric mood. The patient is nervous/anxious.        Objective:   Physical Exam Well-developed male in no acute distress Nose without purulence or discharge noted Chest with decreased breath sounds, no wheezes or rhonchi Cardiac exam is regular rate and rhythm Lower extremities with mild ankle edema, no cyanosis Alert and oriented, moves all 4 extremities.       Assessment & Plan:

## 2012-02-27 NOTE — Patient Instructions (Addendum)
Will give you the flu shot today. No change in medications, stay as active as possible. followup with me in 4mos, but call if having breathing issues.

## 2012-02-27 NOTE — Assessment & Plan Note (Signed)
The patient is near his usual baseline on his current bronchodilator regimen and oxygen.  I've asked him to continue on his current medications, and to try and stay as active as possible.  If doing well, he will follow up with me in 4 months.  We'll give him the flu shot today.

## 2012-02-29 ENCOUNTER — Ambulatory Visit: Payer: Medicare Other | Admitting: Internal Medicine

## 2012-04-19 ENCOUNTER — Ambulatory Visit: Payer: Medicare Other | Admitting: Internal Medicine

## 2012-05-02 ENCOUNTER — Other Ambulatory Visit: Payer: Self-pay | Admitting: Pulmonary Disease

## 2012-05-02 MED ORDER — LISINOPRIL 20 MG PO TABS: 20.0000 mg | ORAL_TABLET | Freq: Every day | ORAL | Status: DC

## 2012-05-02 NOTE — Telephone Encounter (Signed)
[facility] requesting rx  Pt has appt on 07-02-12 90 day supply lisinopril sent

## 2012-05-02 NOTE — Telephone Encounter (Signed)
Called [facility] rx sent by mistake.for lisinopril  pharamacy aware. rx cancelled. pharamacy to sent to dr Scotty Court for refil

## 2012-06-03 ENCOUNTER — Telehealth: Payer: Self-pay | Admitting: Pulmonary Disease

## 2012-06-03 NOTE — Telephone Encounter (Signed)
RX for Lisinopril denied. KC does not follow-the pt for his blood pressure.

## 2012-07-02 ENCOUNTER — Encounter: Payer: Self-pay | Admitting: Pulmonary Disease

## 2012-07-02 ENCOUNTER — Ambulatory Visit (INDEPENDENT_AMBULATORY_CARE_PROVIDER_SITE_OTHER): Payer: Medicare Other | Admitting: Pulmonary Disease

## 2012-07-02 VITALS — BP 146/80 | HR 102 | Temp 97.1°F | Ht 69.0 in | Wt 200.0 lb

## 2012-07-02 DIAGNOSIS — J438 Other emphysema: Secondary | ICD-10-CM

## 2012-07-02 DIAGNOSIS — J9610 Chronic respiratory failure, unspecified whether with hypoxia or hypercapnia: Secondary | ICD-10-CM

## 2012-07-02 MED ORDER — LISINOPRIL 20 MG PO TABS: 20.0000 mg | ORAL_TABLET | Freq: Every day | ORAL | Status: AC

## 2012-07-02 NOTE — Patient Instructions (Addendum)
Stay on current medications, but check with your insurance to see what is covered. Will refer you to pulmonary rehab down at [place] if you get me the name of the hospital.  Weight loss and conditioning are important factors for you to stay functional.  Will fill your lisinopril for the next 30days, but you need to get a primary physician.  followup with me in 4mos.

## 2012-07-02 NOTE — Assessment & Plan Note (Signed)
The patient appears to be a stable baseline currently on his bronchodilator regimen.  I've asked him to continue on his same medications, and stressed to him the importance of weight loss and strengthening.  I have explained that his functional status going forward will be more dependent upon those factors than anything else.  I would like to refer him to a pulmonary rehabilitation program in [place], and he will get the name of the hospital for me.

## 2012-07-02 NOTE — Progress Notes (Signed)
  Subjective:    Patient ID: Bradley Wallace, male    DOB: 23-Apr-1932, 79 y.o.   MRN: [id number]  HPI Patient comes in today for followup of his known COPD with chronic respiratory failure.  He has been staying on his usual bronchodilator regimen, as well as oxygen.  He has not had an acute exacerbation or pulmonary infection since last visit.  He has moved to [place] since the last visit, and no longer is in pulmonary rehabilitation at [facility].  He feels that his exertional tolerance is at baseline currently.   Review of Systems  Constitutional: Negative for fever and unexpected weight change.  HENT: Positive for rhinorrhea. Negative for ear pain, nosebleeds, congestion, sore throat, sneezing, trouble swallowing, dental problem, postnasal drip and sinus pressure.   Eyes: Negative for redness and itching.  Respiratory: Negative for cough, chest tightness, shortness of breath and wheezing.   Cardiovascular: Negative for palpitations and leg swelling.  Gastrointestinal: Negative for nausea and vomiting.  Genitourinary: Negative for dysuria.  Musculoskeletal: Negative for joint swelling.  Skin: Negative for rash.  Neurological: Negative for headaches.  Hematological: Does not bruise/bleed easily.  Psychiatric/Behavioral: Negative for dysphoric mood. The patient is not nervous/anxious.        Objective:   Physical Exam Well-developed male in no acute distress Nose without purulent discharge noted Neck without lymphadenopathy or thyromegaly Chest with decreased breath sounds throughout, no crackles or wheezes Cardiac exam regular rate and rhythm Lower extremities with mild edema, no cyanosis Alert and oriented, moves all 4 extremities.       Assessment & Plan:

## 2012-10-30 ENCOUNTER — Ambulatory Visit: Payer: Medicare Other | Admitting: Pulmonary Disease

## 2012-11-12 ENCOUNTER — Encounter: Payer: Self-pay | Admitting: Pulmonary Disease

## 2012-11-12 ENCOUNTER — Ambulatory Visit (INDEPENDENT_AMBULATORY_CARE_PROVIDER_SITE_OTHER): Payer: Medicare Other | Admitting: Pulmonary Disease

## 2012-11-12 VITALS — BP 148/88 | HR 93 | Temp 97.7°F | Ht 67.0 in | Wt 199.2 lb

## 2012-11-12 DIAGNOSIS — J438 Other emphysema: Secondary | ICD-10-CM

## 2012-11-12 DIAGNOSIS — J9610 Chronic respiratory failure, unspecified whether with hypoxia or hypercapnia: Secondary | ICD-10-CM

## 2012-11-12 DIAGNOSIS — J439 Emphysema, unspecified: Secondary | ICD-10-CM

## 2012-11-12 NOTE — Patient Instructions (Addendum)
Please fax [place] pages on pulmonary medications from your insurance plan document so that we can prescribe alternatives for your meds. Take today's note, along with a copy of your PFT's to the pulmonary rehab program in [place].   Stay on oxygen You need to get yourself a primary care physician.  followup with me in 4mos

## 2012-11-12 NOTE — Progress Notes (Signed)
  Subjective:    Patient ID: Bradley Wallace, male    DOB: [birth year], 79 y.o.   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  He also has chronic respiratory failure with O2 dependency.  Unfortunately, the patient has been having difficulties affording his medicines because of insurance changes, and has not been on his bronchodilator regimen consistently.  He has seen a decline in his exertional tolerance since that time.  He denies any significant chest congestion or purulent mucus.  He has moved to the [place] area, and is trying to enroll in the pulmonary rehabilitation program there.   Review of Systems  Constitutional: Negative for fever and unexpected weight change.  HENT: Negative for ear pain, nosebleeds, congestion, sore throat, rhinorrhea, sneezing, trouble swallowing, dental problem, postnasal drip and sinus pressure.   Eyes: Negative for redness and itching.  Respiratory: Negative for cough, chest tightness, shortness of breath and wheezing.   Cardiovascular: Negative for palpitations and leg swelling.  Gastrointestinal: Negative for nausea and vomiting.  Genitourinary: Negative for dysuria.  Musculoskeletal: Negative for joint swelling.  Skin: Negative for rash.  Neurological: Negative for headaches.  Hematological: Does not bruise/bleed easily.  Psychiatric/Behavioral: Negative for dysphoric mood. The patient is not nervous/anxious.        Objective:   Physical Exam Well-developed male in no acute distress Nose without purulent or discharge noted Chest with decreased breath sounds throughout, no wheezes or rhonchi Cardiac exam with regular rate and rhythm Lower extremities with 1+ edema bilaterally, no cyanosis Alert and oriented, moves all 4 extremities.       Assessment & Plan:

## 2012-11-12 NOTE — Assessment & Plan Note (Signed)
The patient's exertional tolerance is not at his usual baseline, but I suspect this is because he has not been on his maintenance bronchodilator regimen.  I will give him samples of his meds today, but I asked him to send [place] his insurance formulary information so that we can choose an alternative for him.  I have also encouraged him to stay on his oxygen, and also to get back in the pulmonary rehabilitation program.  I have filled out his paperwork today for the rehabilitation program.

## 2013-03-17 ENCOUNTER — Ambulatory Visit: Payer: Medicare Other | Admitting: Pulmonary Disease

## 2013-04-23 ENCOUNTER — Ambulatory Visit (INDEPENDENT_AMBULATORY_CARE_PROVIDER_SITE_OTHER): Payer: Medicare Other | Admitting: Pulmonary Disease

## 2013-04-23 ENCOUNTER — Encounter (INDEPENDENT_AMBULATORY_CARE_PROVIDER_SITE_OTHER): Payer: Self-pay

## 2013-04-23 ENCOUNTER — Encounter: Payer: Self-pay | Admitting: Pulmonary Disease

## 2013-04-23 VITALS — BP 128/80 | HR 92 | Temp 97.9°F | Ht 69.0 in | Wt 193.4 lb

## 2013-04-23 DIAGNOSIS — J438 Other emphysema: Secondary | ICD-10-CM

## 2013-04-23 DIAGNOSIS — J439 Emphysema, unspecified: Secondary | ICD-10-CM

## 2013-04-23 DIAGNOSIS — J9610 Chronic respiratory failure, unspecified whether with hypoxia or hypercapnia: Secondary | ICD-10-CM

## 2013-04-23 NOTE — Progress Notes (Signed)
  Subjective:    Patient ID: Bradley Wallace, male    DOB: 1931/11/17, 79 y.o.   MRN: [id number]  HPI The patient comes in today for followup of his severe COPD with chronic respiratory failure.  He has discontinued all of his pulmonary meds because of formulary issues, and is getting ready to change to a new insurance provider.  He has brought with him his formulary for next year, and we can start him on breo In the place of symbicort.  He has not had a chest infection or acute exacerbation since last visit.   Review of Systems  Constitutional: Negative for fever and unexpected weight change.  HENT: Negative for congestion, dental problem, ear pain, nosebleeds, postnasal drip, rhinorrhea, sinus pressure, sneezing, sore throat and trouble swallowing.   Eyes: Negative for redness and itching.  Respiratory: Negative for cough, chest tightness, shortness of breath and wheezing.   Cardiovascular: Negative for palpitations and leg swelling.  Gastrointestinal: Negative for nausea and vomiting.  Genitourinary: Negative for dysuria.  Musculoskeletal: Negative for joint swelling.  Skin: Negative for rash.  Neurological: Negative for headaches.  Hematological: Does not bruise/bleed easily.  Psychiatric/Behavioral: Negative for dysphoric mood. The patient is not nervous/anxious.        Objective:   Physical Exam Well-developed male in no acute distress Nose without purulence or discharge noted Neck without lymphadenopathy or thyromegaly Chest with decreased breath sounds, no crackles or wheezes Cardiac exam was distant heart sounds but regular Lower extremities with mild ankle edema, no cyanosis Alert and oriented, moves all 4 extremities.       Assessment & Plan:

## 2013-04-23 NOTE — Patient Instructions (Signed)
Will try you on breo since on your future insurance program.  Take one inhalation each am only.  Rinse mouth well.  Restart your spiriva, one inhalation each am Stay as active as possible followup with me in 4mos

## 2013-04-23 NOTE — Assessment & Plan Note (Signed)
The patient appears to be stable from a COPD standpoint, but the goal will be to get him back on some type of regular maintenance bronchodilator regimen.  Virgel Bouquet is covered under his new formulary plan, so will substitute this for his symbicort.  I also asked him to get back on his Spiriva, and to continue on his oxygen therapy.  Most importantly, I have asked him to continue staying as active as possible.

## 2013-07-31 ENCOUNTER — Telehealth: Payer: Self-pay | Admitting: Pulmonary Disease

## 2013-07-31 NOTE — Telephone Encounter (Signed)
Called and spoke with pt and he stated that he has changed his insurance and his pharmacy.  Pt stated that he faxed this information over to our office on 07/24/13 and wanted to make sure that we received this information. Pt stated that his new pharmacy does not have this information and we will need to fax this in with his prescriptions.  Will forward to ashtyn to see if she has received this information.  thanks

## 2013-08-01 MED ORDER — TIOTROPIUM BROMIDE MONOHYDRATE 18 MCG IN CAPS: 18.0000 ug | ORAL_CAPSULE | Freq: Every day | RESPIRATORY_TRACT | Status: DC

## 2013-08-01 MED ORDER — BUDESONIDE-FORMOTEROL FUMARATE 160-4.5 MCG/ACT IN AERO: 2.0000 | INHALATION_SPRAY | Freq: Two times a day (BID) | RESPIRATORY_TRACT | Status: DC

## 2013-08-01 NOTE — Telephone Encounter (Signed)
Refills of symbicort 160-4.545mcg, and spiriva handihaler 18mcg sent to [facility] [address] [place] [facility] per pt request Pt has new insurance. [place] to patient chart.

## 2013-08-20 ENCOUNTER — Encounter (INDEPENDENT_AMBULATORY_CARE_PROVIDER_SITE_OTHER): Payer: Self-pay

## 2013-08-20 ENCOUNTER — Encounter: Payer: Self-pay | Admitting: Pulmonary Disease

## 2013-08-20 ENCOUNTER — Ambulatory Visit (INDEPENDENT_AMBULATORY_CARE_PROVIDER_SITE_OTHER): Payer: Medicare Other | Admitting: Pulmonary Disease

## 2013-08-20 VITALS — BP 122/86 | HR 81 | Temp 97.7°F | Ht 69.0 in | Wt 192.6 lb

## 2013-08-20 DIAGNOSIS — J9610 Chronic respiratory failure, unspecified whether with hypoxia or hypercapnia: Secondary | ICD-10-CM

## 2013-08-20 DIAGNOSIS — J439 Emphysema, unspecified: Secondary | ICD-10-CM

## 2013-08-20 DIAGNOSIS — G4733 Obstructive sleep apnea (adult) (pediatric): Secondary | ICD-10-CM | POA: Insufficient documentation

## 2013-08-20 DIAGNOSIS — G4761 Periodic limb movement disorder: Secondary | ICD-10-CM

## 2013-08-20 DIAGNOSIS — J438 Other emphysema: Secondary | ICD-10-CM

## 2013-08-20 MED ORDER — ROPINIROLE HCL 0.5 MG PO TABS: ORAL_TABLET | ORAL | Status: DC

## 2013-08-20 NOTE — Assessment & Plan Note (Signed)
The patient appears to be stable from a COPD standpoint, with no worsening of his baseline exertional tolerance. He has not had an acute exacerbation or pulmonary infection. I've asked him to continue on his current bronchodilator regimen, but he is concerned about the cost of Spiriva. I have given him a list of alternatives to check and see if they are covered more appropriately on his insurance plan.

## 2013-08-20 NOTE — Assessment & Plan Note (Signed)
The patient was found to only have minimal sleep apnea on his sleep study, but did have a lot of arousals associated with limb movements. Denies chronic pain or neuropathic symptoms, and also denies a history consistent with restless leg syndrome. All of this raises the question whether he may have the periodic limb movement disorder, and whether this may be contributing to his sleep maintenance insomnia. I would like to give him a three-week trial of a dopamine agonist to see if that makes a difference. If it does not, I would discontinue.

## 2013-08-20 NOTE — Patient Instructions (Signed)
Continue on current meds, but check to see if your insurance has alternatives to spiriva as we discussed. Stay as active as possible. Will give you a trial of requip 0.5mg  one after dinner each night.  If you see improvement but still have issues with sleep, can increase to 2 after dinner.  Please call me in 3 weeks with update on how things are going.  followup with me again in 4mos.

## 2013-08-20 NOTE — Progress Notes (Signed)
   Subjective:    Patient ID: Bradley Wallace, male    DOB: 10/30/1931, 80 y.o.   MRN: [id number]  HPI Patient comes in today for followup of his known COPD with chronic respiratory failure. He is staying compliant on his medication and oxygen, and has not had an acute exacerbation since last visit. He feels that his exertional tolerance is at baseline. He has asked me about a recent sleep study that he has had, and I've gone over the results with him.   Review of Systems  Constitutional: Negative for fever and unexpected weight change.  HENT: Negative for congestion, dental problem, ear pain, nosebleeds, postnasal drip, rhinorrhea, sinus pressure, sneezing, sore throat and trouble swallowing.   Eyes: Negative for redness and itching.  Respiratory: Negative for cough, chest tightness, shortness of breath and wheezing.   Cardiovascular: Negative for palpitations and leg swelling.  Gastrointestinal: Negative for nausea and vomiting.  Genitourinary: Negative for dysuria.  Musculoskeletal: Negative for joint swelling.  Skin: Negative for rash.  Neurological: Negative for headaches.  Hematological: Does not bruise/bleed easily.  Psychiatric/Behavioral: Negative for dysphoric mood. The patient is not nervous/anxious.        Objective:   Physical Exam Well-developed male in no acute distress Nose without purulence or discharge noted Neck without lymphadenopathy or thyromegaly Chest with decreased breath sounds, but no wheezing Cardiac exam with regular rate and rhythm Lower extremities with mild edema, no cyanosis Alert and oriented, moves all 4 extremities.       Assessment & Plan:

## 2013-08-20 NOTE — Assessment & Plan Note (Signed)
The patient has minimal sleep apnea on his recent sleep study, and it is unclear to me whether this is the reason for his ongoing sleep disturbance. I am very hesitant to consider CPAP given his severe COPD, and the risk of air trapping with worsening dyspnea. If we have to treat his mild sleep apnea, would have to consider IVAPS.  I would rather see if treatment of his limb movements will help his sleep before he goes on a positive pressure device.  The patient is agreeable to this approach.

## 2013-09-10 ENCOUNTER — Telehealth: Payer: Self-pay | Admitting: Pulmonary Disease

## 2013-09-10 NOTE — Telephone Encounter (Signed)
ATC, NA and no option to leave a msg, WCB 

## 2013-09-11 MED ORDER — BUDESONIDE-FORMOTEROL FUMARATE 160-4.5 MCG/ACT IN AERO: 2.0000 | INHALATION_SPRAY | Freq: Two times a day (BID) | RESPIRATORY_TRACT | Status: DC

## 2013-09-11 MED ORDER — ALBUTEROL SULFATE HFA 108 (90 BASE) MCG/ACT IN AERS: 2.0000 | INHALATION_SPRAY | Freq: Four times a day (QID) | RESPIRATORY_TRACT | Status: AC | PRN

## 2013-09-11 MED ORDER — TIOTROPIUM BROMIDE MONOHYDRATE 18 MCG IN CAPS: 18.0000 ug | ORAL_CAPSULE | Freq: Every day | RESPIRATORY_TRACT | Status: DC

## 2013-09-11 NOTE — Telephone Encounter (Signed)
Called # and LMTCB on home # claled mobile and received message VM has not been set up yet

## 2013-09-11 NOTE — Telephone Encounter (Signed)
Pt calling to give update on condition since August 20, 2013 appt Pt states that he has dropped out of the sleep clinic program--said that Dr Shelle Ironlance would know exactly what he is speaking of. Would not give details.  Pt states that he researched the Requip and read the side effects--- pt states that he is frightened with the list of side effects. Pt states that it seemed to work very well x 1 day, then he developed diarrhea. Stopped taking Requip and diarrhea stopped. Has not started back on Requip--would like to know what to do next?  Expressed concern of the high cost of Spiriva and Symbicort at last OV--given alternatives to check with insurance... Not much difference in price. Pt is going to stay on Spiriva and Symbicort--refills sent. (90-day supply sent to pharmacy)  Requested Rx for ProAir HFA since we did not have any samples last OV. This has been sent to pharmacy along with other requested meds.  Please advise Dr Shelle Ironlance. Thanks.

## 2013-09-12 MED ORDER — PRAMIPEXOLE DIHYDROCHLORIDE 0.125 MG PO TABS: ORAL_TABLET | ORAL | Status: DC

## 2013-09-12 NOTE — Telephone Encounter (Signed)
Called and spoke with pt. Made aware of recs. Rx called in

## 2013-09-12 NOTE — Telephone Encounter (Signed)
lmomtcb x1 on home # Cell # VM is not set up

## 2013-09-12 NOTE — Telephone Encounter (Signed)
Returning call.

## 2013-09-12 NOTE — Telephone Encounter (Signed)
Can try mirapex 0.125mg  after dinner each night.  If legs continue to be an issue, can increase to 0.25mg  after dinner.

## 2013-11-17 ENCOUNTER — Other Ambulatory Visit: Payer: Self-pay | Admitting: Pulmonary Disease

## 2013-12-24 ENCOUNTER — Encounter: Payer: Self-pay | Admitting: Pulmonary Disease

## 2013-12-24 ENCOUNTER — Ambulatory Visit (INDEPENDENT_AMBULATORY_CARE_PROVIDER_SITE_OTHER): Payer: Medicare Other | Admitting: Pulmonary Disease

## 2013-12-24 VITALS — BP 118/84 | HR 89 | Temp 97.8°F | Ht 69.0 in | Wt 195.2 lb

## 2013-12-24 DIAGNOSIS — J9611 Chronic respiratory failure with hypoxia: Secondary | ICD-10-CM

## 2013-12-24 DIAGNOSIS — J438 Other emphysema: Secondary | ICD-10-CM

## 2013-12-24 DIAGNOSIS — G4761 Periodic limb movement disorder: Secondary | ICD-10-CM

## 2013-12-24 DIAGNOSIS — R0902 Hypoxemia: Secondary | ICD-10-CM

## 2013-12-24 DIAGNOSIS — J9610 Chronic respiratory failure, unspecified whether with hypoxia or hypercapnia: Secondary | ICD-10-CM

## 2013-12-24 MED ORDER — PRAMIPEXOLE DIHYDROCHLORIDE 0.125 MG PO TABS: ORAL_TABLET | ORAL | Status: DC

## 2013-12-24 NOTE — Patient Instructions (Signed)
No change in medications. Continue to stay as active as possible, and work on weight reduction as well. followup with me again in 4mos.

## 2013-12-24 NOTE — Progress Notes (Signed)
   Subjective:    Patient ID: Bradley Wallace, Bradley Wallace    DOB: 01/13/1932, 80 y.o.   MRN: [id number]  HPI The patient comes in today for followup of his known COPD with chronic respiratory failure. He is done fairly well since the last visit, with no acute exacerbation or pulmonary infection. The heat humidity is bothering his breathing, but this is expected. Overall, the patient feels that he is near his usual baseline. He was also started on Mirapex at the last visit for PLMD, and he feels this has greatly helped his symptoms in his sleep.   Review of Systems  Constitutional: Negative for fever and unexpected weight change.  HENT: Negative for congestion, dental problem, ear pain, nosebleeds, postnasal drip, rhinorrhea, sinus pressure, sneezing, sore throat and trouble swallowing.   Eyes: Negative for redness and itching.  Respiratory: Positive for shortness of breath. Negative for cough, chest tightness and wheezing.   Cardiovascular: Negative for palpitations and leg swelling.  Gastrointestinal: Negative for nausea and vomiting.  Genitourinary: Negative for dysuria.  Musculoskeletal: Negative for joint swelling.  Skin: Negative for rash.  Neurological: Negative for headaches.  Hematological: Does not bruise/bleed easily.  Psychiatric/Behavioral: Negative for dysphoric mood. The patient is not nervous/anxious.        Objective:   Physical Exam Well-developed Bradley Wallace in no acute distress. Nose without purulence or discharge noted Neck without lymphadenopathy or thyromegaly Chest with very diminished breath sounds, but no active wheezing or crackles Cardiac exam with regular rate and rhythm Lower extremities with minimal edema, no cyanosis Alert and oriented, moves all 4 extremities.        Assessment & Plan:

## 2013-12-24 NOTE — Assessment & Plan Note (Signed)
The patient has done very well with a dopamine agonist, and feels that his sleep is much improved.

## 2013-12-24 NOTE — Assessment & Plan Note (Signed)
The patient has gold D. COPD, but is maintaining a fairly stable baseline on his current regimen. He has not had a recent acute exacerbation. I've asked him to continue on his current meds, and to try and stay as active as possible.

## 2014-04-03 ENCOUNTER — Other Ambulatory Visit: Payer: Self-pay

## 2014-04-24 ENCOUNTER — Ambulatory Visit: Payer: Medicare Other | Admitting: Pulmonary Disease

## 2014-05-22 ENCOUNTER — Ambulatory Visit (INDEPENDENT_AMBULATORY_CARE_PROVIDER_SITE_OTHER): Payer: Medicare Other | Admitting: Pulmonary Disease

## 2014-05-22 ENCOUNTER — Encounter: Payer: Self-pay | Admitting: Pulmonary Disease

## 2014-05-22 VITALS — BP 134/82 | HR 92 | Temp 98.1°F | Ht 68.0 in | Wt 202.8 lb

## 2014-05-22 DIAGNOSIS — J9611 Chronic respiratory failure with hypoxia: Secondary | ICD-10-CM

## 2014-05-22 DIAGNOSIS — J438 Other emphysema: Secondary | ICD-10-CM

## 2014-05-22 DIAGNOSIS — G4761 Periodic limb movement disorder: Secondary | ICD-10-CM

## 2014-05-22 NOTE — Assessment & Plan Note (Signed)
The patient feels that his breathing is at baseline, and has not had any acute exacerbations. I have encouraged him to stay on his bronchodilators and oxygen therapy, and to try and be as active as possible.

## 2014-05-22 NOTE — Patient Instructions (Addendum)
No change in breathing medications. Try to stay as active as possible. Ok to stop taking mirapex as a trial to see if it is really helping legs/sleep. Work on weight reduction. followup with me again in 4mos.

## 2014-05-22 NOTE — Progress Notes (Signed)
   Subjective:    Patient ID: Bradley Wallace, male    DOB: 01/05/1932, 80 y.o.   MRN: [id number]  HPI The patient comes in today for follow-up of his known COPD with chronic respiratory failure. He also has issues with periodic limb movement disorder, and was tried on Mirapex at the last visit. He thinks that his sleep may not be as restless, but he is unsure if it has made a significant difference to his alertness during the day. He feels that his breathing is at baseline, and has not had an acute exacerbation or chest infection since last visit. He is staying on his bronchodilator regimen, and has not had cough or congestion of significance.   Review of Systems  Constitutional: Negative for fever and unexpected weight change.  HENT: Negative for congestion, dental problem, ear pain, nosebleeds, postnasal drip, rhinorrhea, sinus pressure, sneezing, sore throat and trouble swallowing.   Eyes: Negative for redness and itching.  Respiratory: Positive for shortness of breath. Negative for cough, chest tightness and wheezing.   Cardiovascular: Negative for palpitations and leg swelling.  Gastrointestinal: Negative for nausea and vomiting.  Genitourinary: Negative for dysuria.  Musculoskeletal: Negative for joint swelling.  Skin: Negative for rash.  Neurological: Negative for headaches.  Hematological: Does not bruise/bleed easily.  Psychiatric/Behavioral: Negative for dysphoric mood. The patient is not nervous/anxious.        Objective:   Physical Exam Overweight male in no acute distress Nose without purulence or discharge noted Neck without lymphadenopathy or thyromegaly Chest with decreased breath sounds, no crackles or wheezes Cardiac exam with regular rate and rhythm Lower extremities with 1+ edema, no cyanosis Alert and oriented, moves all 4 extremities.       Assessment & Plan:

## 2014-05-22 NOTE — Assessment & Plan Note (Signed)
The patient feels that his sleep may be less restless on the Mirapex, but he is unsure if this. I have told him that it is okay to stop the Mirapex and see if it is really helping his sleep. Also have encouraged him to get more exercise, and work on weight loss.

## 2014-09-25 ENCOUNTER — Encounter: Payer: Self-pay | Admitting: Pulmonary Disease

## 2014-09-25 ENCOUNTER — Ambulatory Visit (INDEPENDENT_AMBULATORY_CARE_PROVIDER_SITE_OTHER): Payer: Medicare Other | Admitting: Pulmonary Disease

## 2014-09-25 VITALS — BP 124/76 | HR 87 | Temp 97.2°F | Ht 68.0 in | Wt 199.8 lb

## 2014-09-25 DIAGNOSIS — J438 Other emphysema: Secondary | ICD-10-CM | POA: Diagnosis not present

## 2014-09-25 DIAGNOSIS — J9611 Chronic respiratory failure with hypoxia: Secondary | ICD-10-CM

## 2014-09-25 NOTE — Patient Instructions (Signed)
No change in medications. Work hard in pulmonary rehab Once you get a [place] in [place], we can send your records.

## 2014-09-25 NOTE — Progress Notes (Signed)
   Subjective:    Patient ID: Bradley Wallace, male    DOB: 06/14/1932, 81 y.o.   MRN: [id number]  HPI Patient comes in today for follow-up of his known severe emphysema/COPD with chronic respiratory failure. He is staying on his bronchodilator regimen, and feels that his breathing is at baseline. He is getting ready to start in pulmonary rehabilitation again in [place], and has not had any recent pulmonary infection.   Review of Systems  Constitutional: Negative for fever and unexpected weight change.  HENT: Negative for congestion, dental problem, ear pain, nosebleeds, postnasal drip, rhinorrhea, sinus pressure, sneezing, sore throat and trouble swallowing.   Eyes: Negative for redness and itching.  Respiratory: Positive for cough and shortness of breath. Negative for chest tightness and wheezing.   Cardiovascular: Negative for palpitations and leg swelling.  Gastrointestinal: Negative for nausea and vomiting.  Genitourinary: Negative for dysuria.  Musculoskeletal: Negative for joint swelling.  Skin: Negative for rash.  Neurological: Negative for headaches.  Hematological: Does not bruise/bleed easily.  Psychiatric/Behavioral: Negative for dysphoric mood. The patient is not nervous/anxious.        Objective:   Physical Exam Well-developed male in no acute distress Nose without purulence or discharge noted Neck without lymphadenopathy or thyromegaly Chest with very diminished breath sounds, no active wheezing Cardiac exam with regular rate and rhythm Lower extremities with mild edema, no cyanosis Alert and oriented, moves all 4 extremities.       Assessment & Plan:

## 2014-09-25 NOTE — Assessment & Plan Note (Signed)
The patient overall is stable from his last visit, and he is getting ready to start in pulmonary rehabilitation again in [place]. He is on a very good bronchodilator regimen, and I have asked him to continue on this.

## 2014-10-12 ENCOUNTER — Other Ambulatory Visit: Payer: Self-pay | Admitting: Pulmonary Disease

## 2014-12-01 ENCOUNTER — Telehealth: Payer: Self-pay | Admitting: Pulmonary Disease

## 2014-12-01 DIAGNOSIS — J449 Chronic obstructive pulmonary disease, unspecified: Secondary | ICD-10-CM

## 2014-12-01 NOTE — Telephone Encounter (Signed)
Patient notified.  Referral entered.  Nothing further needed.

## 2014-12-01 NOTE — Telephone Encounter (Signed)
Patient has moved to [place] and has found a Diplomatic Services operational officer.  States that he needs a referral from our office to see the new Pulmonologist - Dr Alonza Smoker.  Seen here for COPD, OSA and PLMD.  Please advise Tammy is you are able to make this referral since Dr Shelle Iron is no longer here. Thanks.

## 2014-12-01 NOTE — Telephone Encounter (Signed)
That is fine if they will accept from [place] and not PCP  Will need Dr. Shelle Iron notes when established.

## 2014-12-14 ENCOUNTER — Other Ambulatory Visit: Payer: Self-pay

## 2015-06-20 DEATH — deceased
